# Patient Record
Sex: Male | Born: 1945 | Race: White | Hispanic: No | Marital: Married | Smoking: Never smoker
Health system: Southern US, Community
[De-identification: ages and names within clinical notes are randomized; demographics above are authoritative.]

## PROBLEM LIST (undated history)

## (undated) DIAGNOSIS — I471 Supraventricular tachycardia, unspecified: Secondary | ICD-10-CM

## (undated) DIAGNOSIS — S2239XA Fracture of one rib, unspecified side, initial encounter for closed fracture: Secondary | ICD-10-CM

## (undated) DIAGNOSIS — K219 Gastro-esophageal reflux disease without esophagitis: Secondary | ICD-10-CM

## (undated) DIAGNOSIS — I4891 Unspecified atrial fibrillation: Secondary | ICD-10-CM

## (undated) DIAGNOSIS — J939 Pneumothorax, unspecified: Secondary | ICD-10-CM

## (undated) HISTORY — PX: TONSILLECTOMY: SUR1361

## (undated) HISTORY — DX: Supraventricular tachycardia, unspecified: I47.10

## (undated) HISTORY — DX: Supraventricular tachycardia: I47.1

## (undated) HISTORY — PX: WISDOM TOOTH EXTRACTION: SHX21

## (undated) HISTORY — PX: REFRACTIVE SURGERY: SHX103

## (undated) HISTORY — DX: Unspecified atrial fibrillation: I48.91

## (undated) HISTORY — PX: COLONOSCOPY: SHX174

## (undated) HISTORY — DX: Gastro-esophageal reflux disease without esophagitis: K21.9

## (undated) HISTORY — PX: UPPER GASTROINTESTINAL ENDOSCOPY: SHX188

---
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
## 1984-04-19 HISTORY — PX: LUMBAR DISC SURGERY: SHX700

## 2001-01-17 HISTORY — PX: LUMBAR DISC SURGERY: SHX700

## 2001-02-06 ENCOUNTER — Inpatient Hospital Stay (HOSPITAL_COMMUNITY): Admission: EM | Admit: 2001-02-06 | Discharge: 2001-02-08 | Payer: Self-pay | Admitting: Emergency Medicine

## 2001-02-07 ENCOUNTER — Encounter: Payer: Self-pay | Admitting: Family Medicine

## 2001-02-07 ENCOUNTER — Encounter: Payer: Self-pay | Admitting: Neurological Surgery

## 2003-09-18 ENCOUNTER — Encounter: Admission: RE | Admit: 2003-09-18 | Discharge: 2003-09-18 | Payer: Self-pay | Admitting: Family Medicine

## 2004-01-18 ENCOUNTER — Encounter: Payer: Self-pay | Admitting: Internal Medicine

## 2004-01-18 DIAGNOSIS — K219 Gastro-esophageal reflux disease without esophagitis: Secondary | ICD-10-CM | POA: Insufficient documentation

## 2005-05-12 ENCOUNTER — Ambulatory Visit: Payer: Self-pay | Admitting: Internal Medicine

## 2005-09-18 ENCOUNTER — Observation Stay (HOSPITAL_COMMUNITY): Admission: EM | Admit: 2005-09-18 | Discharge: 2005-09-19 | Payer: Self-pay | Admitting: Emergency Medicine

## 2005-09-18 ENCOUNTER — Encounter: Payer: Self-pay | Admitting: Internal Medicine

## 2005-09-18 ENCOUNTER — Ambulatory Visit: Payer: Self-pay | Admitting: Internal Medicine

## 2006-06-17 ENCOUNTER — Ambulatory Visit: Payer: Self-pay | Admitting: Internal Medicine

## 2007-05-31 ENCOUNTER — Ambulatory Visit: Payer: Self-pay | Admitting: Internal Medicine

## 2007-09-08 DIAGNOSIS — K449 Diaphragmatic hernia without obstruction or gangrene: Secondary | ICD-10-CM | POA: Insufficient documentation

## 2009-01-03 ENCOUNTER — Encounter (INDEPENDENT_AMBULATORY_CARE_PROVIDER_SITE_OTHER): Payer: Self-pay | Admitting: *Deleted

## 2009-01-15 ENCOUNTER — Inpatient Hospital Stay (HOSPITAL_COMMUNITY): Admission: EM | Admit: 2009-01-15 | Discharge: 2009-01-16 | Payer: Self-pay | Admitting: Emergency Medicine

## 2009-08-23 ENCOUNTER — Telehealth: Payer: Self-pay | Admitting: Internal Medicine

## 2010-06-05 ENCOUNTER — Encounter (INDEPENDENT_AMBULATORY_CARE_PROVIDER_SITE_OTHER): Payer: Self-pay | Admitting: *Deleted

## 2010-07-04 ENCOUNTER — Encounter (INDEPENDENT_AMBULATORY_CARE_PROVIDER_SITE_OTHER): Payer: Self-pay | Admitting: *Deleted

## 2010-07-09 ENCOUNTER — Ambulatory Visit: Payer: Self-pay | Admitting: Internal Medicine

## 2010-07-24 ENCOUNTER — Encounter: Payer: Self-pay | Admitting: Internal Medicine

## 2010-07-24 ENCOUNTER — Ambulatory Visit
Admission: RE | Admit: 2010-07-24 | Discharge: 2010-07-24 | Payer: Self-pay | Source: Home / Self Care | Attending: Internal Medicine | Admitting: Internal Medicine

## 2010-07-26 ENCOUNTER — Encounter: Payer: Self-pay | Admitting: Internal Medicine

## 2010-08-19 NOTE — Letter (Signed)
Summary: Pre Visit Letter Revised  [facility]  [address] [place], [place] [address]   Phone: [phone]  Fax: [phone]        06/05/2010 MRN: [id number]    Austin Graves [address] [place], [place]  [address]             Procedure Date:  July 24, 2010   Welcome to the Gastroenterology Division at [facility].    You are scheduled to see a nurse for your pre-procedure visit on July 09, 2010 at 8:00 A.M. on the 3rd floor at [facility], [address]  We ask that you try to arrive at our office 15 minutes prior to your appointment time to allow for check-in.  Please take a minute to review the attached form.  If you answer "Yes" to one or more of the questions on the first page, we ask that you call the person listed at your earliest opportunity.  If you answer "No" to all of the questions, please complete the rest of the form and bring it to your appointment.    Your nurse visit will consist of discussing your medical and surgical history, your immediate family medical history, and your medications.   If you are unable to list all of your medications on the form, please bring the medication bottles to your appointment and we will list them.  We will need to be aware of both prescribed and over the counter drugs.  We will need to know exact dosage information as well.    Please be prepared to read and sign documents such as consent forms, a financial agreement, and acknowledgement forms.  If necessary, and with your consent, a friend or relative is welcome to sit-in on the nurse visit with you.  Please bring your insurance card so that we may make a copy of it.  If your insurance requires a referral to see a specialist, please bring your referral form from your primary care physician.  No co-pay is required for this nurse visit.     If you cannot keep your appointment, please call [phone] to cancel or reschedule prior to your appointment  date.  This allows [place] the opportunity to schedule an appointment for another patient in need of care.    Thank you for choosing [facility] for your medical needs.  We appreciate the opportunity to care for you.  Please visit [place] at our website  to learn more about our practice.  Sincerely, The Gastroenterology Division

## 2010-08-19 NOTE — Progress Notes (Signed)
Summary: Schedule Colonoscopy  Phone Note Outgoing Call   Call placed by: Hortense Ramal CMA Duncan Dull),  August 23, 2009 3:52 PM Call placed to: Patient Summary of Call: Patient needs a recall colonoscopy due to his family history of colonic polyps. I have left a message on patient's voicemail for him to call us back. Hortense Ramal CMA Duncan Dull)  August 23, 2009 3:53 PM   Follow-up for Phone Call        ---- 08/26/2009 11:57 AM, Leanor Kail [facility] wrote: Patient can only do fridays. None in Feb or March 2011, will call back for a friday in April or May 2011.

## 2010-08-19 NOTE — Letter (Signed)
Summary: Recall Colonoscopy Letter  [facility]  [address] [place], [place] [address]   Phone: [phone]  Fax: [phone]      January 03, 2009 MRN: [id number]   Austin Graves [address] [place], [place]  [address]   Dear Austin Graves,   According to your medical record, it is time for you to schedule a Colonoscopy. The American Cancer Society recommends this procedure as a method to detect early colon cancer. Patients with a family history of colon cancer, or a personal history of colon polyps or inflammatory bowel disease are at increased risk.  This letter has beeen generated based on the recommendations made at the time of your procedure. If you feel that in your particular situation this may no longer apply, please contact our office.  Please call our office at [phone] to schedule this appointment or to update your records at your earliest convenience.  Thank you for cooperating with [place] to provide you with the very best care possible.   Sincerely,  Wilhemina Bonito. Marina Goodell, M.D.  [facility] Gastroenterology Division [phone]

## 2010-08-19 NOTE — Procedures (Signed)
Summary: Gastroenterology egd  Gastroenterology egd   Imported By: Harrietta Guardian 09/08/2007 11:06:57  _____________________________________________________________________  External Attachment:    Type:   Image     Comment:   External Document

## 2010-08-19 NOTE — Procedures (Signed)
Summary: Gastroenterology colon  Gastroenterology colon   Imported By: Harrietta Guardian 09/08/2007 11:06:23  _____________________________________________________________________  External Attachment:    Type:   Image     Comment:   External Document

## 2010-08-21 NOTE — Procedures (Signed)
Summary: Colonoscopy  Patient: Austin Graves Note: All result statuses are Final unless otherwise noted.  Tests: (1) Colonoscopy (COL)   COL Colonoscopy           DONE     [facility]     [address]     [place], [place]  [address]           COLONOSCOPY PROCEDURE REPORT           PATIENT:  [name], [name]  MR#:  [id number]     BIRTHDATE:  [birth year], 64 yrs. old  GENDER:  male     ENDOSCOPIST:  Wilhemina Bonito. Eda Keys, MD     REF. BY:  Screening / Recall     PROCEDURE DATE:  07/24/2010     PROCEDURE:  Colonoscopy with snare polypectomy X 2     ASA CLASS:  Class II     INDICATIONS:  family Hx of polyps, Routine Risk Screening ; FATHER     WITH PRECANCEROUS COLONIC MASS AGE 50+     MEDICATIONS:   Fentanyl 100 mcg IV, Versed 10 mg IV           DESCRIPTION OF PROCEDURE:   After the risks benefits and     alternatives of the procedure were thoroughly explained, informed     consent was obtained.  Digital rectal exam was performed and     revealed no abnormalities.   The LB 180AL [id number] endoscope was     introduced through the anus and advanced to the cecum, which was     identified by both the appendix and ileocecal valve, without     limitations.Time to cecum = 2:18 min.  The quality of the prep was     excellent, using MoviPrep.  The instrument was then slowly     withdrawn (time = 11:49 min) as the colon was fully examined.     <<PROCEDUREIMAGES>>           FINDINGS:  A 5mm sessile polyp was found in the ascending colon.     Polyp was snared without cautery. Retrieval was successful.   A     diminutive polyp was found in the sigmoid colon. Polyp was snared     without cautery. Retrieval was successful.  Otherwise normal     colonoscopy without other polyps, masses, vascular ectasias, or     inflammatory changes.   Retroflexed views in the rectum revealed     no abnormalities.    The scope was then withdrawn from the patient     and the procedure completed.        COMPLICATIONS:  None     ENDOSCOPIC IMPRESSION:     1) Sessile polyp in the ascending colon - removed     2) Diminutive polyp in the sigmoid colon - removed     3) Otherwise normal colonoscopy           RECOMMENDATIONS:     1) Repeat colonoscopy in 5 years if polyp adenomatous; otherwise     10 years           ______________________________     Wilhemina Bonito. Eda Keys, MD           CC:  R. Robley Fries, MD;  The Patient           n.     eSIGNED:   Wilhemina Bonito. Eda Keys at 07/24/2010 11:42 AM  [place], [place], [id number]  Note: An exclamation mark (!) indicates a result that was not dispersed into the flowsheet. Document Creation Date: 07/24/2010 11:42 AM _______________________________________________________________________  (1) Order result status: Final Collection or observation date-time: 07/24/2010 11:29 Requested date-time:  Receipt date-time:  Reported date-time:  Referring Physician:   Ordering Physician: Fransico Setters [id number]) Specimen Source:  Source: Launa Grill Order Number: [id number] Lab site:   Appended Document: Colonoscopy recall 5 yrs     Procedures Next Due Date:    Colonoscopy: 07/2015

## 2010-08-21 NOTE — Letter (Signed)
Summary: Patient Notice- Polyp Results  [facility]  [address] [place], [place] [address]   Phone: [phone]  Fax: [phone]        July 26, 2010 MRN: [id number]    Austin Graves [address] [place], [place]  [address]    Dear Mr. Austin Graves,  I am pleased to inform you that the colon polyp(s) removed during your recent colonoscopy was (were) found to be benign (no cancer detected) upon pathologic examination.  I recommend you have a repeat colonoscopy examination in 5 years to look for recurrent polyps, as having colon polyps increases your risk for having recurrent polyps or even colon cancer in the future.  Should you develop new or worsening symptoms of abdominal pain, bowel habit changes or bleeding from the rectum or bowels, please schedule an evaluation with either your primary care physician or with me.   Additional information/recommendations:  __ No further action with gastroenterology is needed at this time. Please      follow-up with your primary care physician for your other healthcare      needs.  Please call us if you are having persistent problems or have questions about your condition that have not been fully answered at this time.  Sincerely,  Hilarie Fredrickson MD  This letter has been electronically signed by your physician.  Appended Document: Patient Notice- Polyp Results Letter mailed

## 2010-08-21 NOTE — Letter (Signed)
Summary: [facility] Instructions  [facility]  [address] [place], [place] [address]   Phone: [phone]  Fax: [phone]       Austin Graves    September 04, 1945    MRN: [id number]        Procedure Day /Date: Thursday 07-24-10       Arrival Time: 9:30 am     Procedure Time: 10:30 am     Location of Procedure:                    _x _  [facility] (4th Floor)                        PREPARATION FOR COLONOSCOPY WITH MOVIPREP   Starting 5 days prior to your procedure  07-19-10 do not eat nuts, seeds, popcorn, corn, beans, peas,  salads, or any raw vegetables.  Do not take any fiber supplements (e.g. Metamucil, Citrucel, and Benefiber).  THE DAY BEFORE YOUR PROCEDURE         DATE:  07-23-10  DAY:  Wednesday   1.  Drink clear liquids the entire day-NO SOLID FOOD  2.  Do not drink anything colored red or purple.  Avoid juices with pulp.  No orange juice.  3.  Drink at least 64 oz. (8 glasses) of fluid/clear liquids during the day to prevent dehydration and help the prep work efficiently.  CLEAR LIQUIDS INCLUDE: Water Jello Ice Popsicles Tea (sugar ok, no milk/cream) Powdered fruit flavored drinks Coffee (sugar ok, no milk/cream) Gatorade Juice: apple, white grape, white cranberry  Lemonade Clear bullion, consomm, broth Carbonated beverages (any kind) Strained chicken noodle soup Hard Candy                             4.  In the morning, mix first dose of MoviPrep solution:    Empty 1 Pouch A and 1 Pouch B into the disposable container    Add lukewarm drinking water to the top line of the container. Mix to dissolve    Refrigerate (mixed solution should be used within 24 hrs)  5.  Begin drinking the prep at 5:00 p.m. The MoviPrep container is divided by 4 marks.   Every 15 minutes drink the solution down to the next mark (approximately 8 oz) until the full liter is complete.   6.  Follow completed prep with 16 oz of clear liquid of your choice  (Nothing red or purple).  Continue to drink clear liquids until bedtime.  7.  Before going to bed, mix second dose of MoviPrep solution:    Empty 1 Pouch A and 1 Pouch B into the disposable container    Add lukewarm drinking water to the top line of the container. Mix to dissolve    Refrigerate  THE DAY OF YOUR PROCEDURE      DATE: 07-24-10  DAY:  Thursday  Beginning at  5:30 a.m. (5 hours before procedure):         1. Every 15 minutes, drink the solution down to the next mark (approx 8 oz) until the full liter is complete.  2. Follow completed prep with 16 oz. of clear liquid of your choice.    3. You may drink clear liquids until  8:30 a.m.  (2 HOURS BEFORE PROCEDURE).   MEDICATION INSTRUCTIONS  Unless otherwise instructed, you should take regular prescription medications with a small sip  of water   as early as possible the morning of your procedure.        OTHER INSTRUCTIONS  You will need a responsible adult at least 63 years of age to accompany you and drive you home.   This person must remain in the waiting room during your procedure.  Wear loose fitting clothing that is easily removed.  Leave jewelry and other valuables at home.  However, you may wish to bring a book to read or  an iPod/MP3 player to listen to music as you wait for your procedure to start.  Remove all body piercing jewelry and leave at home.  Total time from sign-in until discharge is approximately 2-3 hours.  You should go home directly after your procedure and rest.  You can resume normal activities the  day after your procedure.  The day of your procedure you should not:   Drive   Make legal decisions   Operate machinery   Drink alcohol   Return to work  You will receive specific instructions about eating, activities and medications before you leave.    The above instructions have been reviewed and explained to me by   Wyona Almas RN  July 09, 2010 8:19 AM     I  fully understand and can verbalize these instructions _____________________________ Date _________

## 2010-08-21 NOTE — Miscellaneous (Signed)
Summary: [facility]/prep  Clinical Lists Changes  Medications: Added new medication of MOVIPREP 100 GM  SOLR (PEG-KCL-NACL-NASULF-NA ASC-C) As per prep instructions. - Signed Rx of MOVIPREP 100 GM  SOLR (PEG-KCL-NACL-NASULF-NA ASC-C) As per prep instructions.;  #1 x 0;  Signed;  Entered by: Wyona Almas RN;  Authorized by: Hilarie Fredrickson MD;  Method used: Electronically to [facility] # [phone]*, [address] [place], [place]  [address], Ph: [phone], Fax: [phone] Observations: Added new observation of NKA: T (07/09/2010 7:57)    Prescriptions: MOVIPREP 100 GM  SOLR (PEG-KCL-NACL-NASULF-NA ASC-C) As per prep instructions.  #1 x 0   Entered by:   Wyona Almas RN   Authorized by:   Hilarie Fredrickson MD   Signed by:   Wyona Almas RN on 07/09/2010   Method used:   Electronically to        [facility] # [phone]* (retail)       [address]       [place], [place]  [address]       Ph: [phone]       Fax: [phone]   RxID:   [id number]

## 2010-10-27 LAB — TYPE AND SCREEN
ABO/RH(D): O POS
Antibody Screen: NEGATIVE

## 2010-10-27 LAB — ABO/RH: ABO/RH(D): O POS

## 2010-10-27 LAB — POCT I-STAT, CHEM 8
BUN: 21 mg/dL (ref 6–23)
Calcium, Ion: 1.14 mmol/L (ref 1.12–1.32)
Chloride: 106 mEq/L (ref 96–112)
Creatinine, Ser: 1.1 mg/dL (ref 0.4–1.5)
Glucose, Bld: 113 mg/dL — ABNORMAL HIGH (ref 70–99)
HCT: 41 % (ref 39.0–52.0)
Hemoglobin: 13.9 g/dL (ref 13.0–17.0)
Potassium: 4 mEq/L (ref 3.5–5.1)
Sodium: 139 mEq/L (ref 135–145)
TCO2: 25 mmol/L (ref 0–100)

## 2010-10-27 LAB — PROTIME-INR
INR: 1 (ref 0.00–1.49)
Prothrombin Time: 13.9 seconds (ref 11.6–15.2)

## 2010-10-27 LAB — APTT: aPTT: 22 seconds — ABNORMAL LOW (ref 24–37)

## 2010-12-02 NOTE — Assessment & Plan Note (Signed)
 HEALTHCARE                         GASTROENTEROLOGY OFFICE NOTE   TRENDEN, HAZELRIGG                       MRN:          [id number]  DATE:05/31/2007                            DOB:          Sep 07, 1945    HISTORY:  Mr. Austin Graves presents today for followup.  He is a 65 year old  with a history of reflux disease, for which he has been maintained on  chronic proton pump inhibitor therapy.  He underwent upper endoscopy  January 18, 2004.  Examination was normal without evidence of Barrett's or  other complications.  He had been maintained on Nexium 40 mg daily.  For  the most part, this controlled symptoms, though he would have occasional  breakthrough.  He was last evaluated June 17, 2006.  At that time,  some breakthrough symptoms.  See that dictation for details.  Symptoms  settled down in time.  This past spring he changed from Nexium to  Prilosec OTC due to insurance formulary preference.  He has been taking  Prilosec OTC 20 mg daily.  Still some occasional breakthrough for which  he takes antacids.  No dysphagia or other problems.   His only medication is Prilosec OTC.   HE HAS NO KNOWN DRUG ALLERGIES.   PHYSICAL EXAMINATION:  Finds a well-appearing male in no acute distress.  Blood pressure is 122/74.  Heart rate is 60.  Weight is 191 pounds.  HEENT:  Sclerae anicteric.  ABDOMEN:  Soft without tenderness, mass, or hernia.  Good bowel sounds  heard.   IMPRESSION:  Gastroesophageal reflux disease.  Currently with good  control on Prilosec OTC.   RECOMMENDATIONS:  1. Continue reflux precautions.  2. Continue Prilosec OTC.  3. For significant breakthrough symptoms, he can increase his dosage      to 40 mg daily, or for rare breakthrough symptoms, continue with      antacids.  4. Routine GI followup in 2 years, unless interval questions or      problems.     Wilhemina Bonito. Marina Goodell, MD  Electronically Signed    JNP/MedQ  DD: 05/31/2007  DT:  05/31/2007  Job #: [id number]
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
## 2010-12-05 NOTE — H&P (Signed)
NAME:  Austin Graves, Austin Graves NO.:  [id number]   MEDICAL RECORD NO.:  [id number]          PATIENT TYPE:  INP   LOCATION:  1823                         FACILITY:  [facility]   PHYSICIAN:  Pricilla Riffle, M.D.    DATE OF BIRTH:  1945/08/28   DATE OF ADMISSION:  09/18/2005  DATE OF DISCHARGE:                                HISTORY & PHYSICAL   PRIMARY CARDIOLOGIST:  Dr. Dietrich Pates (new)   REASON FOR ADMISSION:  Mr. Esguerra is a very pleasant 65 year old male with no  prior cardiac history who now presents to the emergency room via a direct  transfer with Dr. Andrey Campanile for evaluation of new onset midsternal chest pain.   Patient presents with no prior history of exertional chest discomfort.  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  He has never experienced any exertional chest pain, pressure, or  tightness.   Patient awoke approximately 3:00 this morning with midsternal chest pain  which was easily exacerbated by movement of the thorax and upper  extremities.  He finally found a suitable position which ameliorated his  discomfort, went back to sleep.  After awaking this morning, however, he had  recurrent pain which again was was exacerbated by movement, but not by  walking.  He also noted exacerbation with deep inspiration.  He took two  aspirin and then presented to Dr. Andrey Campanile.   A 12-lead electrocardiogram in the office showed no acute changes but given  the patient's persistent discomfort, albeit less intense than the initial  episode earlier this morning, he was taken directly by Dr. Andrey Campanile to the  emergency room.   Patient denies any recent prodromal illnesses other than a mild cold which  resolved approximately two weeks ago.  He also denies any recent paroxysmal  nocturnal dyspnea, orthopnea, or lower extremity edema.   ALLERGIES:  No known drug allergies.   CURRENT MEDICATIONS:  Nexium 40 daily.   PAST MEDICAL  HISTORY:  1.  Gastritis by EGD 2005.  2.  History of palpitations approximately 10 years ago with negative work-up      by a member of Dr. Weyman Croon [facility] group.  3.  Status post appendectomy 1985.  4.  Status post lumbar laminectomy (x2).   SOCIAL HISTORY:  Patient lives here in [place] with his wife.  They have  two children.  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  He  has never smoked tobacco and drinks alcohol only on rare occasion.  As noted  earlier, he is an active cyclist covering some 6000 miles a year.   FAMILY HISTORY:  Mother age 5 with no known heart disease.  Father age 85  status post bypass surgery in the early 1990s.  Siblings have no known heart  disease.  Patient does report a paternal uncle who died at the age of 62  from a heart attack and had history of MI/bypass surgery.   REVIEW OF  SYSTEMS:  As noted per HPI.  Has occasional heartburn with no  documented peptic ulcer disease and reports that these current symptoms are  dissimilar from his typical reflux symptoms.  Remaining systems negative.  Denies any recent fever, chills, productive cough, or sweats.  Denies any  dysuria.  Remaining systems negative.   PHYSICAL EXAMINATION:  VITAL SIGNS:  Blood pressure 156/92, pulse 64,  regular, respirations 20, temperature 98.2, saturations 97% room air.  GENERAL:  65 year old male sitting upright in no apparent distress.  HEENT:  Normocephalic, atraumatic.  NECK:  Preserved bilateral carotid pulses without bruits.  No thyromegaly.  LUNGS:  Clear to auscultation in all fields.  HEART:  Regular rate and rhythm (S1, S2) with no murmurs, rubs, or gallops.  ABDOMEN:  Soft, nontender.  Intact bowel sounds without bruits.  EXTREMITIES:  Palpable peripheral pulses without edema.  NEUROLOGIC:  No focal deficit.   Admission chest x-ray:  Pending.   Admission electrocardiogram:   Normal sinus rhythm at 61 bpm with normal  axis; rSr prime; no ischemic changes.   LABORATORY DATA:  WBC 5.6, hemoglobin 14.7, hematocrit 43.3, platelet 219,  neutrophils 85%.  INR 1.  D-dimer less than 0.22.  Sodium 139, potassium 4,  glucose 110, BUN 15, creatinine 1.  Liver enzymes normal.  Initial cardiac  enzymes:  CPK 77/1.3, troponin I 0.02.   Admission chest x-ray:  Pending.   IMPRESSION:  Mr. Mayol is a very pleasant 65 year old male with no prior  cardiac history and essentially negative cardiac risk factors except age who  presents to the emergency room with chest pain which is atypical for  ischemia.  His discomfort is exacerbated by movement and deep inspiration  and is more suggestive of pleurisy versus pericarditis.  Initial cardiac  enzymes are negative as is a D-dimer and EKG is benign.   PLAN:  Patient will be admitted for overnight observation and we will cycle  cardiac markers and repeat an EKG in the morning.  We will also add an ESR  level.  A 2-D echocardiogram has been ordered to exclude pericardial  effusion in assessment of left ventricular function and rule out of any  structural abnormalities.   Medical therapy will consist of aspirin and a trial of nonsteroidals with  ibuprofen at 600 mg t.i.d.  Further recommendations will be made pending  review of the echocardiogram and, if work-up is benign and the patient's  symptoms are improved, recommendation would be to discharge him home in the  morning with continued trial of nonsteroidals.      Gene Serpe, P.A. [facility]    ______________________________  Pricilla Riffle, M.D.    GS/MEDQ  D:  09/18/2005  T:  09/18/2005  Job:  [id number]   cc:   Vale Haven. Andrey Campanile, M.D.  Fax: [phone]

## 2010-12-05 NOTE — Op Note (Signed)
[place]  Patient:    Austin Graves, Austin Graves                       MRN: [id number] Proc. Date: 02/07/01 Adm. 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  OPERATION PERFORMED:  Right L4-5 microendoscopic diskectomy.  SURGEON:  Stefani Dama, M.D.  ASSISTANT:  None.  ANESTHESIA:  General endotracheal.  INDICATIONS FOR PROCEDURE:  The patient is a 65 year old right-handed individual who had a herniated nucleus pulposus at L4-5 with severe right lumbar radiculopathy including weakness in the tibialis anterior and extensor hallucis longus.  He was advised regarding the surgery.  DESCRIPTION OF PROCEDURE:  The patient was brought to the operating room supine on the stretcher.  After smooth induction of general endotracheal anesthesia, he was turned prone.  The back was shaved, prepped with DuraPrep and draped in a sterile fashion.  A fluoroscopy unit was used to localize the L4-5 space and then a right paramedian entry site was chosen over the lamina of L4.  A K-wire was passed to the lamina of L4 and then using a series of dilators, a 5 cm x 18 mm diameter endoscopic cannula was placed over the L4-5 interspace.  This was affixed to the table with a frame.  The microscope was then draped and brought into the field and used for microdissection technique. The soft tissues were cleared from the interlaminar space at L4-5.  Then using a high speed drill and 2.3 mm dissecting tool, the laminotomy was created removing the inferior margin of the lamina of L4 out to the mesial wall of the facet on the right side, the yellow ligament was taken up in two layers and then the dissection was carried out to  the lateral aspect.  The common dural tube was identified.  The lateral aspect of the dural tube was identified and then the dissection was carried down along the dural tube to the take-off of the L5 nerve root.  The nerve root was noted to be tented dorsally.  With some microdissection technique and division of the epidural veins in this area, the sac was mobilized medially and underneath this was noted to be a large fragment of disk in the L4-5 space.  This fragment was removed in a piecemeal fashion and this allowed for gradual good decompression of the L5 nerve root in its exit zone.  The procedure was continued and there was noted to be a fragment of disk that protruded from the disk space itself.  The posterior longitudinal ligament was opened with a #15 blade and then a series of 2 and 3 mm Kerrison punches was used to remove some of the posterior longitudinal ligament to enter the disk space more fully and a significant quantity of markedly degenerated disk material was encountered in the L4-5 space.  This was cleared so as to relieve the pressure on the common dural tube and the take off of the L5 nerve root.  In the end the disk space was evacuated of a significant quantity of markedly degenerated disk material.  Once this was finished, hemostasis in the  soft tissues was obtained.  The path of the common dural tube and take off of the L5 nerve root was noted to be clear and hemostatically maintained.  The procedure was then discontinued removing the endoscopic cannula and closing the fascia with 3-0 Vicryl in interrupted fashion and 3-0 Vicryl in the subcuticular tissues.  The patient tolerated the procedure well. DD:  02/07/01 TD:  02/08/01 Job: [id number]

## 2010-12-05 NOTE — Assessment & Plan Note (Signed)
[facility]                         GASTROENTEROLOGY OFFICE NOTE   CHOU, BUSLER                       MRN:          [id number]  DATE:06/17/2006                            DOB:          1946/01/11    A former patient of Dr. Weyman Croon [facility]'s.   HISTORY:  The patient presents today for a followup.   He is a 65 year old who was evaluated in May of 2005 for reflux disease  and screening colonoscopy.  The colonoscopy performed January 18, 2004 was  normal.  In addition, upper endoscopy was normal.  He was diagnosed with  gastroesophageal reflux disease.  Since that time, he has continued on  Nexium.  He was evaluated May 12, 2005 in followup, at that time  doing well on medications.  He presents today for followup.  In the  interim, he has been healthy.  He did report to the hospital for  problems, eventually diagnosed as musculoskeletal chest pain.  More  recently, he has noticed some breakthrough symptoms on Nexium.  This can  occur particularly after lunch.  Noted about 2 to 3 times per week.  Does use antacids p.r.n.  No dysphagia or abdominal pain.  His weight  has been stable.  We also discussed recent study regarding possible  issues with calcium absorption and osteoporosis secondary to proton pump  inhibitors.   PHYSICAL EXAM:  Well-appearing, now in no acute distress.  Blood pressure 110/78, heart rate 60, weight 191.6 pounds.  LUNGS:  Clear.  HEART:  Regular.  ABDOMEN:  Soft without tenderness, mass, or hernia.  No organomegaly.   IMPRESSION:  Gastroesophageal reflux disease.  Currently with some  breakthrough symptoms on Nexium 40 mg daily.   RECOMMENDATIONS:  1. Continue Nexium 40 mg daily.  A prescription with multiple refills      has been provided.  I have also given him 10 days of samples of      Nexium should his breakthrough symptoms worsen. If so, I would like      him to try b.i.d. dosage for 10 days and if his quality of  life is      improved, then we can adjust his prescription.  Otherwise, he can      feel free to use antacids on a p.r.n. basis.  2. Ongoing adherence to reflux precautions.  3. Office followup in 1 year unless interval questions or problems.     Wilhemina Bonito. Marina Goodell, MD  Electronically Signed   JNP/MedQ  DD: 06/17/2006  DT: 06/17/2006  Job #: [id number]   cc:   Quita Skye. Artis Flock, M.D.

## 2010-12-05 NOTE — Discharge Summary (Signed)
NAMEDUSTIN, Austin Graves NO.:  [id number]   MEDICAL RECORD NO.:  [id number]          PATIENT TYPE:  OBV   LOCATION:  4715                         FACILITY:  [facility]   PHYSICIAN:  Jesse Sans. Wall, M.D.   DATE OF BIRTH:  05/04/1946   DATE OF ADMISSION:  09/18/2005  DATE OF DISCHARGE:  09/19/2005                                 DISCHARGE SUMMARY   PRIMARY CARDIOLOGIST:  Pricilla Riffle, M.D. (new).   PRINCIPAL DIAGNOSIS:  Musculoskeletal chest pain.   SECONDARY DIAGNOSES:  1.  History of gastritis.  2.  Remote palpitations.   REASON FOR ADMISSION:  Mr. Beigel is a 65 year old male, with no prior  cardiac history, who was referred directly to the emergency room by Dr.  Andrey Campanile for evaluation of new onset mid sternal chest pain.   HOSPITAL COURSE:  Patient's symptoms were felt to be most likely  musculoskeletal in etiology based on history and physical examination  findings.  He was kept for overnight observation and ruled out for  myocardial infarction with all serial cardiac markers within normal limits.  Additionally, D-dimer was negative.   A 2-D echocardiography was performed to exclude pericardial effusion.  This  showed normal left ventricular function with mild aortic valve thickness and  minimal anterior pericardial effusion.   Patient was cleared for discharge by Dr. Valera Castle, with instructions to  continue medical therapy with ibuprofen for a duration of 10 days.   LABORATORY DATA:  Normal serial cardiac markers (three sets).  Lipid  profile:  Total cholesterol 135, triglycerides 66, HDL 46 and LD 76.  D-  dimer less than 0.22.  TSH 1.39.  CBC normal.  Electrolytes and renal  function normal.  Liver enzymes normal.   DISCHARGE MEDICATIONS:  1.  Advil 600 mg q.i.d. (x10 days).  2.  Nexium 40 mg daily.   FOLLOW UP:  Patient was instructed to arrange follow-up with Dr. Margrett Rud in the next few weeks.   DISPOSITION:  Stable.   DURATION OF  DISCHARGE ENCOUNTER:  Less than 30 minutes.      Gene Serpe, P.A. [facility]      Thomas C. Wall, M.D.  Electronically Signed    GS/MEDQ  D:  10/15/2005  T:  10/16/2005  Job:  [id number]   cc:   Vale Haven. Andrey Campanile, M.D.  Fax: [phone]

## 2010-12-05 NOTE — H&P (Signed)
[place]  Patient:    Austin Graves, Austin Graves                         MRN: [id number] Adm. Date:  02/06/01 Attending:  Vale Haven. Andrey Campanile, M.D.                         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  He was seen in the office on February 04, 2001, with some difficulty in dorsiflexing his right foot, and back pain with positive straight leg raising.  He was begun on anti-inflammatory, muscle relaxant, and prednisone, and also was given Vicodin a day afterwards.  He had had horrible pain and was unable to control it despite all of the above medications, and was seen in the emergency room and examined, and found to have paresthesias of the lower extremity with continued severe pain, uncontrollable with outpatient narcotics.  He is admitted for an MRI scan, pain control, a surgical consultation, and probable laminectomy.  ALLERGIES:  No known drug allergies.  CURRENT MEDICATIONS:  None.  FAMILY HISTORY:  His father has had heart disease and hypertension.  His mother has had cancer and a cerebral hemorrhage.  He has one brother.  SOCIAL HISTORY:  No tobacco.  Married.  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  He is very healthy and active.  He rides a bike.  PAST MEDICAL/SURGICAL HISTORY/HOSPITALIZATIONS:  The patient has had a prior laminectomy 17 years prior at I believe all three levels.  REVIEW OF SYSTEMS:  The patient is a healthy active person who has no general complaints or ongoing illness.  He had an episode of palpitations three years ago, which was not felt to be significant by a cardiology consultation.  PHYSICAL EXAMINATION:  VITAL SIGNS:  Temperature 98 degrees, pulse 80, respirations 18, blood pressure 140/80.  SKIN:  Cool, dry, good turgor, no acute  rash.  HEENT:  Tympanic membranes clear.  Mucous membranes moist.  NECK:  No neck masses or bruits.  CARDIORESPIRATORY:  No murmur heard.  The PMI is normal.  A regular rate and rhythm.  No rales or rhonchi.  ABDOMEN:  Soft, nontender.  Bowel sounds normal.  No masses or organomegaly.  NEUROLOGIC/MUSCULOSKELETAL:  Positive straight leg raising test.  Normal deep tendon reflexes in the lower extremities.  Severe pain with the slightest movement of the right leg.  There is a mild right foot weakness.  IMPRESSION ON ADMISSION:  Recurring lumbar disk disease.  PLAN:  See the orders.  We will obtain an MRI and a neurosurgical consultation, along with IV morphine for pain. DD:  02/06/01 TD:  02/07/01 Job: [id number]

## 2013-07-19 DIAGNOSIS — L723 Sebaceous cyst: Secondary | ICD-10-CM | POA: Diagnosis not present

## 2013-07-19 DIAGNOSIS — L02229 Furuncle of trunk, unspecified: Secondary | ICD-10-CM | POA: Diagnosis not present

## 2013-08-04 DIAGNOSIS — L723 Sebaceous cyst: Secondary | ICD-10-CM | POA: Diagnosis not present

## 2013-09-20 DIAGNOSIS — N529 Male erectile dysfunction, unspecified: Secondary | ICD-10-CM | POA: Diagnosis not present

## 2013-09-20 DIAGNOSIS — E559 Vitamin D deficiency, unspecified: Secondary | ICD-10-CM | POA: Diagnosis not present

## 2013-09-20 DIAGNOSIS — K219 Gastro-esophageal reflux disease without esophagitis: Secondary | ICD-10-CM | POA: Diagnosis not present

## 2013-09-20 DIAGNOSIS — L820 Inflamed seborrheic keratosis: Secondary | ICD-10-CM | POA: Diagnosis not present

## 2013-09-20 DIAGNOSIS — H9319 Tinnitus, unspecified ear: Secondary | ICD-10-CM | POA: Diagnosis not present

## 2013-09-20 DIAGNOSIS — Z0000 Encounter for general adult medical examination without abnormal findings: Secondary | ICD-10-CM | POA: Diagnosis not present

## 2013-09-20 DIAGNOSIS — H9190 Unspecified hearing loss, unspecified ear: Secondary | ICD-10-CM | POA: Diagnosis not present

## 2013-09-20 DIAGNOSIS — Z125 Encounter for screening for malignant neoplasm of prostate: Secondary | ICD-10-CM | POA: Diagnosis not present

## 2013-09-20 DIAGNOSIS — M654 Radial styloid tenosynovitis [de Quervain]: Secondary | ICD-10-CM | POA: Diagnosis not present

## 2013-11-22 DIAGNOSIS — M79609 Pain in unspecified limb: Secondary | ICD-10-CM | POA: Diagnosis not present

## 2013-11-22 DIAGNOSIS — M654 Radial styloid tenosynovitis [de Quervain]: Secondary | ICD-10-CM | POA: Diagnosis not present

## 2014-04-14 DIAGNOSIS — Z23 Encounter for immunization: Secondary | ICD-10-CM | POA: Diagnosis not present

## 2014-06-28 DIAGNOSIS — H903 Sensorineural hearing loss, bilateral: Secondary | ICD-10-CM | POA: Diagnosis not present

## 2014-09-05 DIAGNOSIS — M659 Synovitis and tenosynovitis, unspecified: Secondary | ICD-10-CM | POA: Diagnosis not present

## 2014-09-05 DIAGNOSIS — M1812 Unilateral primary osteoarthritis of first carpometacarpal joint, left hand: Secondary | ICD-10-CM | POA: Diagnosis not present

## 2014-09-05 DIAGNOSIS — M654 Radial styloid tenosynovitis [de Quervain]: Secondary | ICD-10-CM | POA: Diagnosis not present

## 2014-09-28 DIAGNOSIS — Z79899 Other long term (current) drug therapy: Secondary | ICD-10-CM | POA: Diagnosis not present

## 2014-09-28 DIAGNOSIS — N529 Male erectile dysfunction, unspecified: Secondary | ICD-10-CM | POA: Diagnosis not present

## 2014-09-28 DIAGNOSIS — H9313 Tinnitus, bilateral: Secondary | ICD-10-CM | POA: Diagnosis not present

## 2014-09-28 DIAGNOSIS — E780 Pure hypercholesterolemia: Secondary | ICD-10-CM | POA: Diagnosis not present

## 2014-09-28 DIAGNOSIS — K219 Gastro-esophageal reflux disease without esophagitis: Secondary | ICD-10-CM | POA: Diagnosis not present

## 2014-09-28 DIAGNOSIS — Z125 Encounter for screening for malignant neoplasm of prostate: Secondary | ICD-10-CM | POA: Diagnosis not present

## 2014-09-28 DIAGNOSIS — Z0000 Encounter for general adult medical examination without abnormal findings: Secondary | ICD-10-CM | POA: Diagnosis not present

## 2014-09-28 DIAGNOSIS — E559 Vitamin D deficiency, unspecified: Secondary | ICD-10-CM | POA: Diagnosis not present

## 2014-09-28 DIAGNOSIS — Z136 Encounter for screening for cardiovascular disorders: Secondary | ICD-10-CM | POA: Diagnosis not present

## 2014-09-28 DIAGNOSIS — R208 Other disturbances of skin sensation: Secondary | ICD-10-CM | POA: Diagnosis not present

## 2014-09-28 DIAGNOSIS — H9193 Unspecified hearing loss, bilateral: Secondary | ICD-10-CM | POA: Diagnosis not present

## 2014-09-28 DIAGNOSIS — Z23 Encounter for immunization: Secondary | ICD-10-CM | POA: Diagnosis not present

## 2014-11-22 ENCOUNTER — Encounter: Payer: Self-pay | Admitting: Internal Medicine

## 2015-05-27 DIAGNOSIS — Z23 Encounter for immunization: Secondary | ICD-10-CM | POA: Diagnosis not present

## 2015-08-03 ENCOUNTER — Emergency Department (HOSPITAL_COMMUNITY): Payer: Medicare Other

## 2015-08-03 ENCOUNTER — Inpatient Hospital Stay (HOSPITAL_COMMUNITY)
Admission: EM | Admit: 2015-08-03 | Discharge: 2015-08-08 | DRG: 200 | Disposition: A | Discharge status: Home / Self Care | Payer: Medicare Other | Attending: General Surgery | Admitting: General Surgery

## 2015-08-03 ENCOUNTER — Encounter (HOSPITAL_COMMUNITY): Payer: Self-pay | Admitting: Emergency Medicine

## 2015-08-03 DIAGNOSIS — S43112A Subluxation of left acromioclavicular joint, initial encounter: Secondary | ICD-10-CM | POA: Diagnosis not present

## 2015-08-03 DIAGNOSIS — J869 Pyothorax without fistula: Secondary | ICD-10-CM | POA: Diagnosis not present

## 2015-08-03 DIAGNOSIS — S2242XA Multiple fractures of ribs, left side, initial encounter for closed fracture: Secondary | ICD-10-CM | POA: Diagnosis present

## 2015-08-03 DIAGNOSIS — S299XXA Unspecified injury of thorax, initial encounter: Secondary | ICD-10-CM | POA: Diagnosis not present

## 2015-08-03 DIAGNOSIS — S272XXA Traumatic hemopneumothorax, initial encounter: Secondary | ICD-10-CM

## 2015-08-03 DIAGNOSIS — Z79899 Other long term (current) drug therapy: Secondary | ICD-10-CM | POA: Diagnosis not present

## 2015-08-03 DIAGNOSIS — S0990XA Unspecified injury of head, initial encounter: Secondary | ICD-10-CM | POA: Diagnosis not present

## 2015-08-03 DIAGNOSIS — S199XXA Unspecified injury of neck, initial encounter: Secondary | ICD-10-CM | POA: Diagnosis not present

## 2015-08-03 DIAGNOSIS — S43102A Unspecified dislocation of left acromioclavicular joint, initial encounter: Secondary | ICD-10-CM | POA: Diagnosis present

## 2015-08-03 DIAGNOSIS — J939 Pneumothorax, unspecified: Secondary | ICD-10-CM

## 2015-08-03 DIAGNOSIS — Z4682 Encounter for fitting and adjustment of non-vascular catheter: Secondary | ICD-10-CM

## 2015-08-03 DIAGNOSIS — Z23 Encounter for immunization: Secondary | ICD-10-CM | POA: Diagnosis not present

## 2015-08-03 DIAGNOSIS — S2239XA Fracture of one rib, unspecified side, initial encounter for closed fracture: Secondary | ICD-10-CM | POA: Diagnosis present

## 2015-08-03 DIAGNOSIS — S270XXA Traumatic pneumothorax, initial encounter: Secondary | ICD-10-CM | POA: Diagnosis present

## 2015-08-03 DIAGNOSIS — S2249XA Multiple fractures of ribs, unspecified side, initial encounter for closed fracture: Secondary | ICD-10-CM | POA: Diagnosis not present

## 2015-08-03 DIAGNOSIS — T148 Other injury of unspecified body region: Secondary | ICD-10-CM | POA: Diagnosis not present

## 2015-08-03 DIAGNOSIS — M25512 Pain in left shoulder: Secondary | ICD-10-CM | POA: Diagnosis not present

## 2015-08-03 DIAGNOSIS — S4992XA Unspecified injury of left shoulder and upper arm, initial encounter: Secondary | ICD-10-CM | POA: Diagnosis not present

## 2015-08-03 DIAGNOSIS — Z9689 Presence of other specified functional implants: Secondary | ICD-10-CM

## 2015-08-03 DIAGNOSIS — R079 Chest pain, unspecified: Secondary | ICD-10-CM | POA: Diagnosis not present

## 2015-08-03 DIAGNOSIS — M25552 Pain in left hip: Secondary | ICD-10-CM | POA: Diagnosis not present

## 2015-08-03 HISTORY — DX: Pneumothorax, unspecified: J93.9

## 2015-08-03 HISTORY — DX: Fracture of one rib, unspecified side, initial encounter for closed fracture: S22.39XA

## 2015-08-03 LAB — BASIC METABOLIC PANEL
Anion gap: 9 (ref 5–15)
Anion gap: 11 (ref 5–15)
BUN: 20 mg/dL (ref 6–20)
BUN: 19 mg/dL (ref 6–20)
CO2: 26 mmol/L (ref 22–32)
CO2: 23 mmol/L (ref 22–32)
Calcium: 9.4 mg/dL (ref 8.9–10.3)
Calcium: 9.1 mg/dL (ref 8.9–10.3)
Chloride: 102 mmol/L (ref 101–111)
Chloride: 102 mmol/L (ref 101–111)
Creatinine, Ser: 1.03 mg/dL (ref 0.61–1.24)
Creatinine, Ser: 0.8 mg/dL (ref 0.61–1.24)
GFR calc Af Amer: >60 mL/min (ref >60)
GFR calc Af Amer: >60 mL/min (ref >60)
GFR calc non Af Amer: >60 mL/min (ref >60)
GFR calc non Af Amer: >60 mL/min (ref >60)
Glucose, Bld: 125 mg/dL — ABNORMAL HIGH (ref 65–99)
Glucose, Bld: 145 mg/dL — ABNORMAL HIGH (ref 65–99)
Potassium: 3.9 mmol/L (ref 3.5–5.1)
Potassium: 4.2 mmol/L (ref 3.5–5.1)
Sodium: 137 mmol/L (ref 135–145)
Sodium: 136 mmol/L (ref 135–145)

## 2015-08-03 LAB — CBC
HCT: 41.5 % (ref 39.0–52.0)
Hemoglobin: 14 g/dL (ref 13.0–17.0)
MCH: 31.8 pg (ref 26.0–34.0)
MCHC: 33.7 g/dL (ref 30.0–36.0)
MCV: 94.3 fL (ref 78.0–100.0)
Platelets: 177 10*3/uL (ref 150–400)
RBC: 4.4 MIL/uL (ref 4.22–5.81)
RDW: 12.7 % (ref 11.5–15.5)
WBC: 10.7 10*3/uL — ABNORMAL HIGH (ref 4.0–10.5)

## 2015-08-03 LAB — CBC WITH DIFFERENTIAL/PLATELET
Basophils Absolute: 0 10*3/uL (ref 0.0–0.1)
Basophils Relative: 0 %
Eosinophils Absolute: 0.1 10*3/uL (ref 0.0–0.7)
Eosinophils Relative: 1 %
HCT: 42 % (ref 39.0–52.0)
Hemoglobin: 14 g/dL (ref 13.0–17.0)
Lymphocytes Relative: 15 %
Lymphs Abs: 1.1 10*3/uL (ref 0.7–4.0)
MCH: 31.8 pg (ref 26.0–34.0)
MCHC: 33.3 g/dL (ref 30.0–36.0)
MCV: 95.5 fL (ref 78.0–100.0)
Monocytes Absolute: 0.3 10*3/uL (ref 0.1–1.0)
Monocytes Relative: 4 %
Neutro Abs: 6.2 10*3/uL (ref 1.7–7.7)
Neutrophils Relative %: 80 %
Platelets: 209 10*3/uL (ref 150–400)
RBC: 4.4 MIL/uL (ref 4.22–5.81)
RDW: 12.8 % (ref 11.5–15.5)
WBC: 7.7 10*3/uL (ref 4.0–10.5)

## 2015-08-03 MED ORDER — ONDANSETRON HCL 4 MG/2ML IJ SOLN: 4.0000 mg | Freq: Four times a day (QID) | INTRAMUSCULAR | Status: DC | PRN

## 2015-08-03 MED ORDER — FENTANYL CITRATE (PF) 100 MCG/2ML IJ SOLN
100.0000 ug | INTRAMUSCULAR | Status: AC | PRN
  Administered 2015-08-03 (×2): 100 ug via INTRAVENOUS
  Filled 2015-08-03 (×2): qty 2

## 2015-08-03 MED ORDER — HYDROMORPHONE 1 MG/ML IV SOLN
INTRAVENOUS | Status: DC
  Administered 2015-08-04: 08:00:00 via INTRAVENOUS
  Administered 2015-08-04: 3 mg via INTRAVENOUS
  Administered 2015-08-04: 2.1 mg via INTRAVENOUS
  Administered 2015-08-04: 1.8 mg via INTRAVENOUS
  Administered 2015-08-05: 0.9 mg via INTRAVENOUS
  Administered 2015-08-05: 1.5 mg via INTRAVENOUS
  Filled 2015-08-03: qty 25

## 2015-08-03 MED ORDER — OXYCODONE HCL 5 MG PO TABS
10.0000 mg | ORAL_TABLET | ORAL | Status: DC | PRN
  Administered 2015-08-04: 10 mg via ORAL
  Filled 2015-08-03: qty 2

## 2015-08-03 MED ORDER — OXYCODONE HCL 5 MG PO TABS
5.0000 mg | ORAL_TABLET | ORAL | Status: DC | PRN
  Administered 2015-08-03: 5 mg via ORAL
  Filled 2015-08-03: qty 1

## 2015-08-03 MED ORDER — IOHEXOL 300 MG/ML  SOLN
75.0000 mL | Freq: Once | INTRAMUSCULAR | Status: AC | PRN
  Administered 2015-08-03: 75 mL via INTRAVENOUS

## 2015-08-03 MED ORDER — MORPHINE SULFATE (PF) 4 MG/ML IV SOLN
4.0000 mg | INTRAVENOUS | Status: DC | PRN
  Administered 2015-08-03 – 2015-08-04 (×3): 4 mg via INTRAVENOUS
  Filled 2015-08-03 (×4): qty 1

## 2015-08-03 MED ORDER — NALOXONE HCL 0.4 MG/ML IJ SOLN: 0.4000 mg | INTRAMUSCULAR | Status: DC | PRN

## 2015-08-03 MED ORDER — ONDANSETRON HCL 4 MG PO TABS: 4.0000 mg | ORAL_TABLET | Freq: Four times a day (QID) | ORAL | Status: DC | PRN

## 2015-08-03 MED ORDER — DIPHENHYDRAMINE HCL 50 MG/ML IJ SOLN: 12.5000 mg | Freq: Four times a day (QID) | INTRAMUSCULAR | Status: DC | PRN

## 2015-08-03 MED ORDER — SODIUM CHLORIDE 0.9 % IJ SOLN: 9.0000 mL | INTRAMUSCULAR | Status: DC | PRN

## 2015-08-03 MED ORDER — POTASSIUM CHLORIDE IN NACL 20-0.9 MEQ/L-% IV SOLN
INTRAVENOUS | Status: DC
  Administered 2015-08-03 – 2015-08-05 (×3): via INTRAVENOUS
  Filled 2015-08-03 (×4): qty 1000

## 2015-08-03 MED ORDER — SODIUM CHLORIDE 0.9 % IV BOLUS (SEPSIS)
1000.0000 mL | Freq: Once | INTRAVENOUS | Status: AC
  Administered 2015-08-03: 1000 mL via INTRAVENOUS

## 2015-08-03 MED ORDER — DIPHENHYDRAMINE HCL 12.5 MG/5ML PO ELIX: 12.5000 mg | ORAL_SOLUTION | Freq: Four times a day (QID) | ORAL | Status: DC | PRN

## 2015-08-03 MED ORDER — ENOXAPARIN SODIUM 40 MG/0.4ML ~~LOC~~ SOLN
40.0000 mg | SUBCUTANEOUS | Status: DC
  Administered 2015-08-04 – 2015-08-08 (×5): 40 mg via SUBCUTANEOUS
  Filled 2015-08-03 (×6): qty 0.4

## 2015-08-03 NOTE — ED Notes (Signed)
MD at bedside. 

## 2015-08-03 NOTE — ED Notes (Signed)
Pt. Is back in room. 
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
## 2015-08-03 NOTE — ED Notes (Signed)
Patient returned from X-ray 

## 2015-08-03 NOTE — ED Notes (Signed)
Called lab for update on pending results. They are checking on results now.

## 2015-08-03 NOTE — ED Notes (Signed)
Pt. Is back in room 1621

## 2015-08-03 NOTE — Progress Notes (Signed)
Orthopedic Tech Progress Note Patient Details:  Austin Graves Pamer 08/14/1945 [id number]  Patient ID: Austin Graves Hessel, male   DOB: 01/03/1946, 70 y.o.   MRN: [id number] Did not apply sling to right arm as per rn says it was an error and to only put it on left shoulder.  Trinna PostMartinez, Chandel Zaun J 08/03/2015, 7:16 PM

## 2015-08-03 NOTE — ED Notes (Signed)
Pt. Going to CT
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
## 2015-08-03 NOTE — ED Notes (Signed)
Patient transported to CT 
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
## 2015-08-03 NOTE — ED Notes (Signed)
Patient transported to X-ray 

## 2015-08-03 NOTE — Progress Notes (Signed)
Orthopedic Tech Progress Note Patient Details:  Austin Graves 04/13/1946 [id number]  Ortho Devices Type of Ortho Device: Shoulder immobilizer Ortho Device/Splint Location: lue Ortho Device/Splint Interventions: Ordered, Application   Trinna PostMartinez, Bryleigh Ottaway J 08/03/2015, 7:13 PM

## 2015-08-03 NOTE — ED Notes (Signed)
Patient's wife: Darreld Mcleanerri Siegel may be reached at [phone] should anyone have any questions.

## 2015-08-04 ENCOUNTER — Inpatient Hospital Stay (HOSPITAL_COMMUNITY): Payer: Medicare Other

## 2015-08-04 LAB — MRSA PCR SCREENING: MRSA by PCR: NEGATIVE

## 2015-08-04 MED ORDER — PNEUMOCOCCAL VAC POLYVALENT 25 MCG/0.5ML IJ INJ
0.5000 mL | INJECTION | INTRAMUSCULAR | Status: AC
  Administered 2015-08-05: 0.5 mL via INTRAMUSCULAR
  Filled 2015-08-04: qty 0.5

## 2015-08-04 MED ORDER — LIDOCAINE HCL (PF) 1 % IJ SOLN
INTRAMUSCULAR | Status: AC
  Administered 2015-08-04: 10 mL
  Filled 2015-08-04: qty 5

## 2015-08-04 MED ORDER — LIDOCAINE HCL (PF) 1 % IJ SOLN
INTRAMUSCULAR | Status: AC
  Filled 2015-08-04: qty 5

## 2015-08-04 MED ORDER — LIDOCAINE HCL (CARDIAC) 20 MG/ML IV SOLN
INTRAVENOUS | Status: AC
  Filled 2015-08-04: qty 5

## 2015-08-04 MED ORDER — MIDAZOLAM HCL 2 MG/2ML IJ SOLN
INTRAMUSCULAR | Status: AC
  Administered 2015-08-04: 2 mg
  Filled 2015-08-04: qty 2
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
## 2015-08-05 ENCOUNTER — Inpatient Hospital Stay (HOSPITAL_COMMUNITY): Payer: Medicare Other

## 2015-08-05 DIAGNOSIS — S43102A Unspecified dislocation of left acromioclavicular joint, initial encounter: Secondary | ICD-10-CM | POA: Diagnosis present

## 2015-08-05 DIAGNOSIS — S270XXA Traumatic pneumothorax, initial encounter: Secondary | ICD-10-CM | POA: Diagnosis not present

## 2015-08-05 MED ORDER — HYDROMORPHONE HCL 1 MG/ML IJ SOLN
0.5000 mg | INTRAMUSCULAR | Status: DC | PRN
  Administered 2015-08-05 (×2): 0.5 mg via INTRAVENOUS
  Filled 2015-08-05 (×3): qty 1

## 2015-08-05 MED ORDER — POLYETHYLENE GLYCOL 3350 17 G PO PACK
17.0000 g | PACK | Freq: Every day | ORAL | Status: DC
  Administered 2015-08-05 – 2015-08-08 (×4): 17 g via ORAL
  Filled 2015-08-05 (×4): qty 1

## 2015-08-05 MED ORDER — OXYCODONE HCL 5 MG PO TABS
5.0000 mg | ORAL_TABLET | ORAL | Status: DC | PRN
  Administered 2015-08-05 (×3): 15 mg via ORAL
  Administered 2015-08-06 (×3): 10 mg via ORAL
  Administered 2015-08-06: 15 mg via ORAL
  Administered 2015-08-07: 10 mg via ORAL
  Filled 2015-08-05: qty 3
  Filled 2015-08-05: qty 2
  Filled 2015-08-05: qty 3
  Filled 2015-08-05 (×2): qty 2
  Filled 2015-08-05 (×2): qty 3
  Filled 2015-08-05: qty 2
  Filled 2015-08-05: qty 1

## 2015-08-05 MED ORDER — DOCUSATE SODIUM 100 MG PO CAPS
100.0000 mg | ORAL_CAPSULE | Freq: Two times a day (BID) | ORAL | Status: DC
Start: 2015-08-05 — End: 2015-08-08
  Administered 2015-08-05 – 2015-08-08 (×7): 100 mg via ORAL
  Filled 2015-08-05 (×7): qty 1

## 2015-08-05 MED ORDER — NAPROXEN 250 MG PO TABS
500.0000 mg | ORAL_TABLET | Freq: Two times a day (BID) | ORAL | Status: DC
  Administered 2015-08-05 – 2015-08-08 (×7): 500 mg via ORAL
  Filled 2015-08-05 (×7): qty 2

## 2015-08-05 NOTE — Progress Notes (Signed)
Patient ID: Austin Graves, male   DOB: 02/06/1946, 70 y.o.   MRN: [id number]   LOS: 2 days   Subjective: C/o left chest pain, otherwise no change.   Objective: Vital signs in last 24 hours: Temp:  [98.1 F (36.7 C)-99.1 F (37.3 C)] 99.1 F (37.3 C) (01/16 0449) Pulse Rate:  [57-62] 60 (01/16 0451) Resp:  [12-25] 12 (01/16 0451) BP: (108-132)/(72-75) 132/75 mmHg (01/16 0451) SpO2:  [89 %-99 %] 98 % (01/16 0451)     IS: 750ml   CT No air leak  No OP   Radiology Results CXR: Left PTX increased after resolution with chest tube yesterday (official read pending)   Physical Exam General appearance: alert and no distress Resp: clear to auscultation bilaterally Cardio: regular rate and rhythm GI: normal findings: bowel sounds normal and soft, non-tender   Assessment/Plan: BCC Left AC separation -- Sling per Dr. Victorino DikeHewitt, f/u as OP with Dr. Rennis ChrisSupple on Friday Left rib fx w/PTX s/p CT -- Will increase suction to 30cm FEN -- D/C PCA, orals for pain, add NSAID, SL IV, advance diet VTE -- SCD's, Lovenox Dispo -- To floor    Freeman CaldronMichael J. Annaly Skop, PA-C Pager: [phone] General [facility] [place] Pager: [phone]  08/05/2015

## 2015-08-05 NOTE — Progress Notes (Addendum)
Subjective:     [facility]  Patient reports pain as mild to moderate.  Tolerating POs well.  Admits to BM.  Denies fever, chills, N/V.  Denies numbness/tingling into LUE.  Objective:   VITALS:  Temp:  [98 F (36.7 C)-99.1 F (37.3 C)] 98.4 F (36.9 C) (01/16 1247) Pulse Rate:  [57-63] 63 (01/16 1247) Resp:  [11-16] 16 (01/16 1247) BP: (114-144)/(72-89) 144/83 mmHg (01/16 1247) SpO2:  [89 %-98 %] 93 % (01/16 1247)  General: WDWN patient in NAD. Psych:  Appropriate mood and affect. Neuro:  A&O x 3, Moving all extremities, sensation intact to light touch HEENT:  EOMs intact Chest:  Even non-labored respirations Skin: C/D/I, no rashes or lesions Extremities: warm/dry, mild edema, no erythema or echymosis.  No lymphadenopathy. Pulses: Radial 2+ MSK:  ROM: Elbow 0-100, MMT: Grip strength 5/5 bilaterally    LABS  Recent Labs  08/03/15 1320 08/03/15 1957  HGB 14.0 14.0  WBC 7.7 10.7*  PLT 209 177    Recent Labs  08/03/15 1320 08/03/15 1957  NA 137 136  K 3.9 4.2  CL 102 102  CO2 26 23  BUN 20 19  CREATININE 1.03 0.80  GLUCOSE 125* 145*   No results for input(s): LABPT, INR in the last 72 hours.   Assessment/Plan:     PTX AC separation  PTX treatment per trauma service Sling for comfort of LUE Activity as tolerated Plan for potential outpatient office visit with Dr. Rennis ChrisSupple on Friday.  Alfredo MartinezJustin Jaece Ducharme, PA-C, [facility] Office:  [phone]

## 2015-08-06 ENCOUNTER — Inpatient Hospital Stay (HOSPITAL_COMMUNITY): Payer: Medicare Other

## 2015-08-06 NOTE — Care Management Important Message (Signed)
Important Message  Patient Details  Name: Austin Graves MRN: [id number] Date of Birth: 05/27/1946   Medicare Important Message Given:  Yes    Oralia Rud [facility] 08/06/2015, 3:36 PM

## 2015-08-06 NOTE — Progress Notes (Signed)
Subjective: Pt says the left shoulder is sore but not as bad as the ribs.  He had a chest tube placed on the left after the PTX was larger Sunday morning.   Objective: Vital signs in last 24 hours: Temp:  [97.9 F (36.6 C)] 97.9 F (36.6 C) (01/17 0534) Pulse Rate:  [54-63] 54 (01/17 0534) Resp:  [19-20] 20 (01/17 0534) BP: (122-135)/(78) 122/78 mmHg (01/17 0534) SpO2:  [92 %-94 %] 94 % (01/17 0534)  Intake/Output from previous day: 01/16 0701 - 01/17 0700 In: 840 [P.O.:840] Out: 675 [Urine:625; Chest Tube:50] Intake/Output this shift: Total I/O In: -  Out: 270 [Urine:250; Chest Tube:20]   Recent Labs  08/03/15 1957  HGB 14.0    Recent Labs  08/03/15 1957  WBC 10.7*  RBC 4.40  HCT 41.5  PLT 177    Recent Labs  08/03/15 1957  NA 136  K 4.2  CL 102  CO2 23  BUN 19  CREATININE 0.80  GLUCOSE 145*  CALCIUM 9.1   No results for input(s): LABPT, INR in the last 72 hours.  PE:  wn wd male in nad.  L shoulder slightly ttp at [facility] joint.  Skin intact.  NVI at L UE.  Distal clavicle slightly more prominent that the R side.  Assessment/Plan: L AC separation.  - continue sling to LUE.  Pt should f/u with Dr. Rennis Chris in the office when he's discharged.  F/u info in Epic.  Ortho signing off.   Toni Arthurs 08/06/2015, 4:48 PM
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
## 2015-08-07 ENCOUNTER — Inpatient Hospital Stay (HOSPITAL_COMMUNITY): Payer: Medicare Other

## 2015-08-07 MED ORDER — SACCHAROMYCES BOULARDII 250 MG PO CAPS
250.0000 mg | ORAL_CAPSULE | Freq: Every day | ORAL | Status: DC
Start: 2015-08-07 — End: 2015-08-08
  Administered 2015-08-07 – 2015-08-08 (×2): 250 mg via ORAL
  Filled 2015-08-07 (×2): qty 1

## 2015-08-07 MED ORDER — PANTOPRAZOLE SODIUM 40 MG PO TBEC
40.0000 mg | DELAYED_RELEASE_TABLET | Freq: Every day | ORAL | Status: DC
  Administered 2015-08-07 – 2015-08-08 (×2): 40 mg via ORAL
  Filled 2015-08-07 (×2): qty 1
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
## 2015-08-08 ENCOUNTER — Inpatient Hospital Stay (HOSPITAL_COMMUNITY): Payer: Medicare Other

## 2015-08-08 MED ORDER — DOCUSATE SODIUM 100 MG PO CAPS
100.0000 mg | ORAL_CAPSULE | Freq: Two times a day (BID) | ORAL | Status: DC
Start: 2015-08-08 — End: 2017-11-16

## 2015-08-08 MED ORDER — POLYETHYLENE GLYCOL 3350 17 G PO PACK: 17.0000 g | PACK | Freq: Every day | ORAL | Status: DC | PRN

## 2015-08-08 MED ORDER — NAPROXEN 500 MG PO TABS: 500.0000 mg | ORAL_TABLET | Freq: Two times a day (BID) | ORAL | Status: DC

## 2015-08-08 MED ORDER — OXYCODONE HCL 5 MG PO TABS: 5.0000 mg | ORAL_TABLET | Freq: Four times a day (QID) | ORAL | Status: DC | PRN
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
## 2015-08-08 NOTE — Progress Notes (Addendum)
Austin Graves to be D/C'd Home per MD order.  Discussed with the patient and all questions fully answered.  VSS. Dressing to left chest clean dry and intact. Sling on left arm.    An After Visit Summary was printed and given to the patient. Patient received prescription.  D/c education completed with patient/family including follow up instructions, medication list, d/c activities limitations if indicated, with other d/c instructions as indicated by MD - patient able to verbalize understanding, all questions fully answered.   Patient instructed to return to ED, call 911, or call MD for any changes in condition.   Patient to be escorted via WC, and D/C home via private auto.  L'[place]PER[place], Austin Graves C 08/08/2015 2:49 PM

## 2015-08-09 ENCOUNTER — Telehealth (HOSPITAL_COMMUNITY): Payer: Self-pay | Admitting: Orthopedic Surgery

## 2015-08-09 NOTE — Telephone Encounter (Signed)
Left message that those 2 medicines were OTC.

## 2015-08-12 DIAGNOSIS — S7002XA Contusion of left hip, initial encounter: Secondary | ICD-10-CM | POA: Diagnosis not present

## 2015-08-12 DIAGNOSIS — S43102A Unspecified dislocation of left acromioclavicular joint, initial encounter: Secondary | ICD-10-CM | POA: Diagnosis not present

## 2015-08-14 DIAGNOSIS — S270XXS Traumatic pneumothorax, sequela: Secondary | ICD-10-CM | POA: Diagnosis not present

## 2015-08-14 DIAGNOSIS — S2242XS Multiple fractures of ribs, left side, sequela: Secondary | ICD-10-CM | POA: Diagnosis not present

## 2015-09-09 DIAGNOSIS — S43102D Unspecified dislocation of left acromioclavicular joint, subsequent encounter: Secondary | ICD-10-CM | POA: Diagnosis not present

## 2015-09-09 DIAGNOSIS — M8589 Other specified disorders of bone density and structure, multiple sites: Secondary | ICD-10-CM | POA: Diagnosis not present

## 2015-09-11 ENCOUNTER — Encounter: Payer: Self-pay | Admitting: Internal Medicine

## 2015-09-11 ENCOUNTER — Other Ambulatory Visit: Payer: Self-pay | Admitting: Orthopedic Surgery

## 2015-09-11 DIAGNOSIS — M8589 Other specified disorders of bone density and structure, multiple sites: Secondary | ICD-10-CM

## 2015-09-11 DIAGNOSIS — Z8781 Personal history of (healed) traumatic fracture: Secondary | ICD-10-CM

## 2015-09-11 DIAGNOSIS — R5381 Other malaise: Secondary | ICD-10-CM

## 2015-09-27 ENCOUNTER — Ambulatory Visit
Admission: RE | Admit: 2015-09-27 | Discharge: 2015-09-27 | Disposition: A | Discharge status: Home / Self Care | Payer: Medicare Other | Source: Ambulatory Visit | Attending: Orthopedic Surgery | Admitting: Orthopedic Surgery

## 2015-09-27 DIAGNOSIS — M25552 Pain in left hip: Secondary | ICD-10-CM | POA: Diagnosis not present

## 2015-09-27 DIAGNOSIS — Z8781 Personal history of (healed) traumatic fracture: Secondary | ICD-10-CM

## 2015-09-27 DIAGNOSIS — M8589 Other specified disorders of bone density and structure, multiple sites: Secondary | ICD-10-CM

## 2015-10-10 DIAGNOSIS — M25552 Pain in left hip: Secondary | ICD-10-CM | POA: Diagnosis not present

## 2015-10-25 DIAGNOSIS — Z1389 Encounter for screening for other disorder: Secondary | ICD-10-CM | POA: Diagnosis not present

## 2015-10-25 DIAGNOSIS — E559 Vitamin D deficiency, unspecified: Secondary | ICD-10-CM | POA: Diagnosis not present

## 2015-10-25 DIAGNOSIS — M791 Myalgia: Secondary | ICD-10-CM | POA: Diagnosis not present

## 2015-10-25 DIAGNOSIS — H9193 Unspecified hearing loss, bilateral: Secondary | ICD-10-CM | POA: Diagnosis not present

## 2015-10-25 DIAGNOSIS — N529 Male erectile dysfunction, unspecified: Secondary | ICD-10-CM | POA: Diagnosis not present

## 2015-10-25 DIAGNOSIS — K219 Gastro-esophageal reflux disease without esophagitis: Secondary | ICD-10-CM | POA: Diagnosis not present

## 2015-10-25 DIAGNOSIS — Z0001 Encounter for general adult medical examination with abnormal findings: Secondary | ICD-10-CM | POA: Diagnosis not present

## 2015-10-25 DIAGNOSIS — H9313 Tinnitus, bilateral: Secondary | ICD-10-CM | POA: Diagnosis not present

## 2016-05-12 DIAGNOSIS — Z23 Encounter for immunization: Secondary | ICD-10-CM | POA: Diagnosis not present

## 2016-12-01 DIAGNOSIS — Z0001 Encounter for general adult medical examination with abnormal findings: Secondary | ICD-10-CM | POA: Diagnosis not present

## 2016-12-01 DIAGNOSIS — K219 Gastro-esophageal reflux disease without esophagitis: Secondary | ICD-10-CM | POA: Diagnosis not present

## 2016-12-01 DIAGNOSIS — H9193 Unspecified hearing loss, bilateral: Secondary | ICD-10-CM | POA: Diagnosis not present

## 2016-12-01 DIAGNOSIS — N500 Atrophy of testis: Secondary | ICD-10-CM | POA: Diagnosis not present

## 2016-12-01 DIAGNOSIS — H9313 Tinnitus, bilateral: Secondary | ICD-10-CM | POA: Diagnosis not present

## 2016-12-01 DIAGNOSIS — L720 Epidermal cyst: Secondary | ICD-10-CM | POA: Diagnosis not present

## 2016-12-01 DIAGNOSIS — Z125 Encounter for screening for malignant neoplasm of prostate: Secondary | ICD-10-CM | POA: Diagnosis not present

## 2016-12-01 DIAGNOSIS — Z1389 Encounter for screening for other disorder: Secondary | ICD-10-CM | POA: Diagnosis not present

## 2016-12-01 DIAGNOSIS — E559 Vitamin D deficiency, unspecified: Secondary | ICD-10-CM | POA: Diagnosis not present

## 2016-12-01 DIAGNOSIS — N529 Male erectile dysfunction, unspecified: Secondary | ICD-10-CM | POA: Diagnosis not present

## 2016-12-28 DIAGNOSIS — M5416 Radiculopathy, lumbar region: Secondary | ICD-10-CM | POA: Diagnosis not present

## 2017-03-05 DIAGNOSIS — M5416 Radiculopathy, lumbar region: Secondary | ICD-10-CM | POA: Diagnosis not present

## 2017-03-05 DIAGNOSIS — L720 Epidermal cyst: Secondary | ICD-10-CM | POA: Diagnosis not present

## 2017-04-23 IMAGING — DX DG CHEST 2V
2 series · 2 of 2 positions shown · non-contrast
Comparison: Radiograph dated 08/03/2015 and CT dated 08/03/2015

CLINICAL DATA: 69-year-old male with left-sided pneumothorax.
Follow-up.

EXAM:
CHEST  2 VIEW

[chest lat]
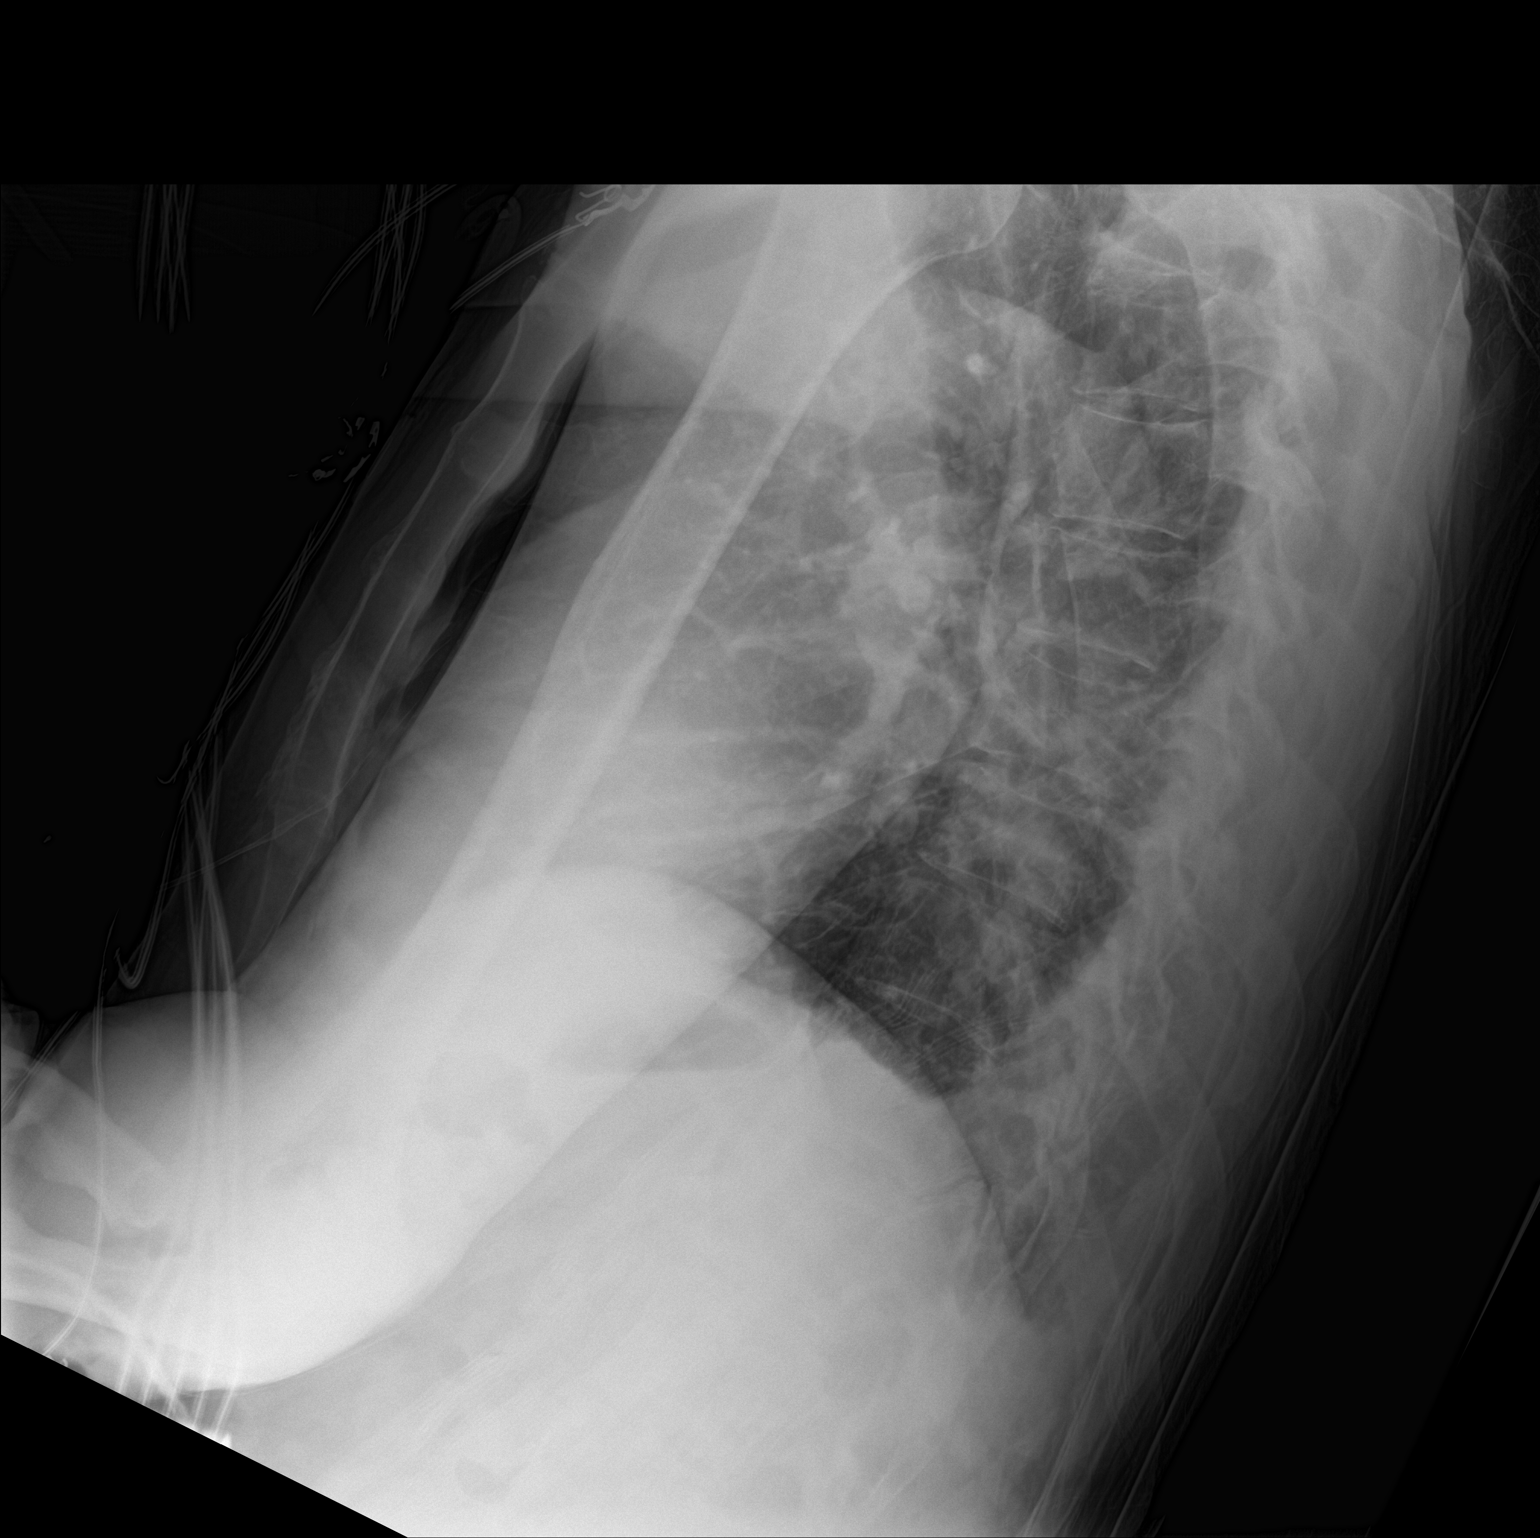

[chest ap]
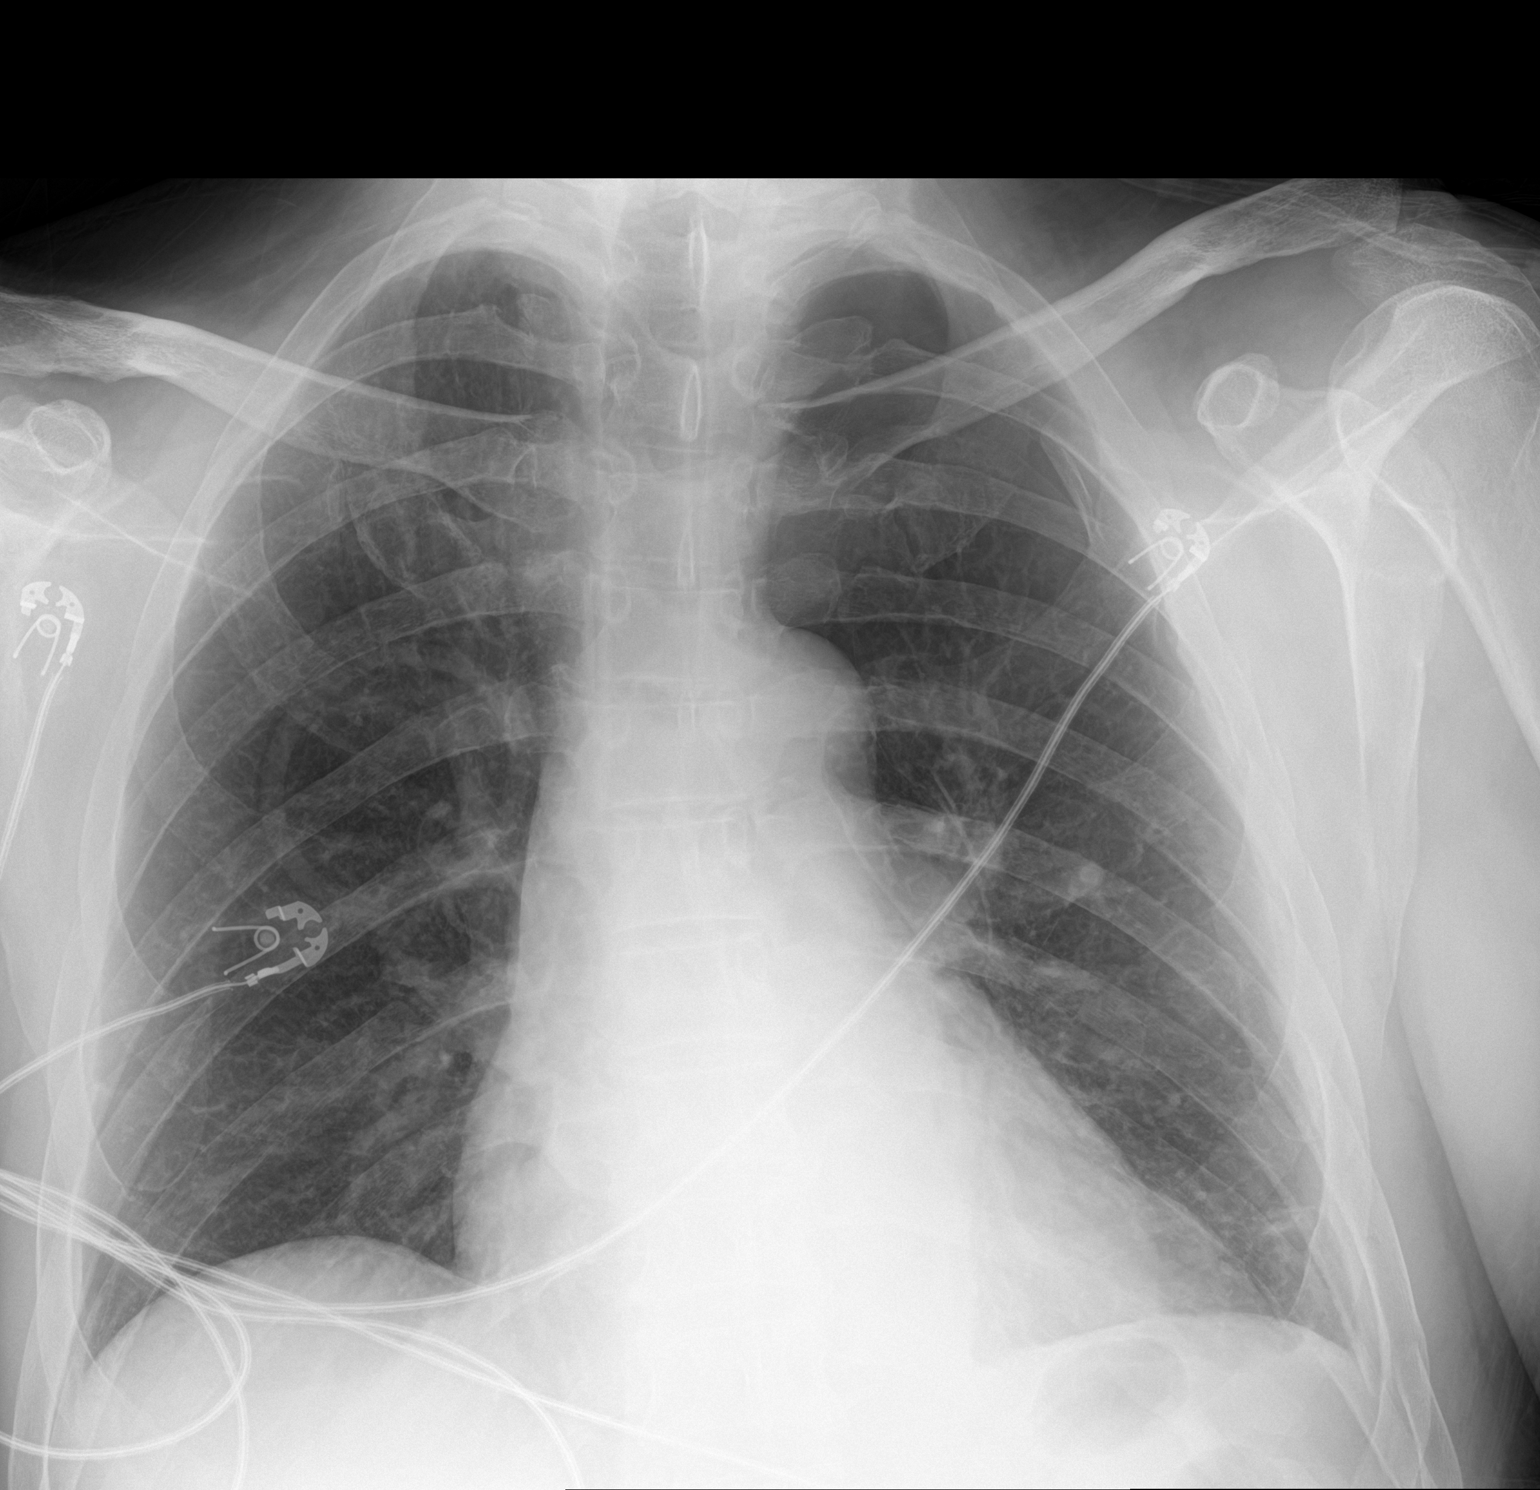

[2 of 2 positions shown; findings below may reference images not displayed]

FINDINGS: There has been interval increase in the left apical pneumothorax
when compared to the radiograph dated 08/03/2015. The pneumothorax
measures approximately 3.6 cm from the apical pleural surface. The
lungs are otherwise clear. No pleural effusion. The cardiac
silhouette is within normal limits.

Mildly displaced fracture of the left 8th rib.
IMPRESSION: Persistent small left-sided pneumothorax.  Follow-up recommended.

## 2017-04-26 DIAGNOSIS — Z23 Encounter for immunization: Secondary | ICD-10-CM | POA: Diagnosis not present

## 2017-04-26 DIAGNOSIS — M5416 Radiculopathy, lumbar region: Secondary | ICD-10-CM | POA: Diagnosis not present

## 2017-04-26 DIAGNOSIS — R030 Elevated blood-pressure reading, without diagnosis of hypertension: Secondary | ICD-10-CM | POA: Diagnosis not present

## 2017-10-05 ENCOUNTER — Encounter: Payer: Self-pay | Admitting: Internal Medicine

## 2017-10-06 ENCOUNTER — Encounter: Payer: Self-pay | Admitting: Internal Medicine

## 2017-10-27 DIAGNOSIS — L720 Epidermal cyst: Secondary | ICD-10-CM | POA: Diagnosis not present

## 2017-11-16 ENCOUNTER — Ambulatory Visit (AMBULATORY_SURGERY_CENTER): Payer: Self-pay | Admitting: *Deleted

## 2017-11-16 ENCOUNTER — Other Ambulatory Visit: Payer: Self-pay

## 2017-11-16 VITALS — Ht 74.0 in | Wt 191.4 lb

## 2017-11-16 DIAGNOSIS — Z86010 Personal history of colonic polyps: Secondary | ICD-10-CM

## 2017-11-16 MED ORDER — SUPREP BOWEL PREP KIT 17.5-3.13-1.6 GM/177ML PO SOLN: 1.0000 | Freq: Once | ORAL | 0 refills | Status: AC

## 2017-11-16 NOTE — Progress Notes (Signed)
Patient denies any allergies to egg or soy products. Patient denies complications with anesthesia/sedation.  Patient denies oxygen use at home and denies diet medications. Patient denies information on colonoscopy. 

## 2017-12-01 ENCOUNTER — Encounter: Payer: Self-pay | Admitting: Internal Medicine

## 2017-12-01 ENCOUNTER — Ambulatory Visit (AMBULATORY_SURGERY_CENTER): Payer: Medicare Other | Admitting: Internal Medicine

## 2017-12-01 ENCOUNTER — Other Ambulatory Visit: Payer: Self-pay

## 2017-12-01 VITALS — BP 112/74 | HR 49 | Temp 95.9°F | Resp 16 | Ht 74.0 in | Wt 191.0 lb

## 2017-12-01 DIAGNOSIS — Z86010 Personal history of colonic polyps: Secondary | ICD-10-CM | POA: Diagnosis not present

## 2017-12-01 DIAGNOSIS — K219 Gastro-esophageal reflux disease without esophagitis: Secondary | ICD-10-CM | POA: Diagnosis not present

## 2017-12-01 MED ORDER — SODIUM CHLORIDE 0.9 % IV SOLN: 500.0000 mL | Freq: Once | INTRAVENOUS | Status: DC

## 2017-12-01 NOTE — Progress Notes (Signed)
Pt's states no medical or surgical changes since previsit or office visit. 

## 2017-12-01 NOTE — Progress Notes (Signed)
Report given to PACU, vss 

## 2017-12-01 NOTE — Op Note (Signed)
[facility] Patient Name: Austin Graves Procedure Date: 12/01/2017 11:29 AM MRN: [id number] Endoscopist: Wilhemina Bonito. Marina Goodell , MD Age: 72 Referring MD:  Date of Birth: 1946/05/10 Gender: Male Account #: [id number] Procedure:                Colonoscopy Indications:              High risk colon cancer surveillance: Personal                            history of non-advanced adenoma Previous                            examinations 2005 and 2012 Medicines:                Monitored Anesthesia Care Procedure:                Pre-Anesthesia Assessment:                           - Prior to the procedure, a History and Physical                            was performed, and patient medications and                            allergies were reviewed. The patient's tolerance of                            previous anesthesia was also reviewed. The risks                            and benefits of the procedure and the sedation                            options and risks were discussed with the patient.                            All questions were answered, and informed consent                            was obtained. Prior Anticoagulants: The patient has                            taken no previous anticoagulant or antiplatelet                            agents. ASA Grade Assessment: I - A normal, healthy                            patient. After reviewing the risks and benefits,                            the patient was deemed in satisfactory condition to  undergo the procedure.                           After obtaining informed consent, the colonoscope                            was passed under direct vision. Throughout the                            procedure, the patient's blood pressure, pulse, and                            oxygen saturations were monitored continuously. The                            Colonoscope was introduced through the anus and                 advanced to the the cecum, identified by                            appendiceal orifice and ileocecal valve. The                            ileocecal valve, appendiceal orifice, and rectum                            were photographed. The quality of the bowel                            preparation was excellent. The colonoscopy was                            performed without difficulty. The patient tolerated                            the procedure well. The bowel preparation used was                            SUPREP. Scope In: 11:38:43 AM Scope Out: 11:48:15 AM Scope Withdrawal Time: 0 hours 7 minutes 31 seconds  Total Procedure Duration: 0 hours 9 minutes 32 seconds  Findings:                 The entire examined colon appeared normal on direct                            and retroflexion views. Complications:            No immediate complications. Estimated blood loss:                            None. Estimated Blood Loss:     Estimated blood loss: none. Impression:               - The entire examined colon is normal on direct and  retroflexion views.                           - No specimens collected. Recommendation:           - Repeat colonoscopy in 10 years for surveillance.                           - Patient has a contact number available for                            emergencies. The signs and symptoms of potential                            delayed complications were discussed with the                            patient. Return to normal activities tomorrow.                            Written discharge instructions were provided to the                            patient.                           - Resume previous diet.                           - Continue present medications. Wilhemina Bonito. Marina Goodell, MD 12/01/2017 11:52:54 AM This report has been signed electronically.

## 2017-12-01 NOTE — Patient Instructions (Signed)
Discharge instructions given. Normal exam. Resume previous medications. YOU HAD AN ENDOSCOPIC PROCEDURE TODAY AT THE [facility]:   Refer to the procedure report that was given to you for any specific questions about what was found during the examination.  If the procedure report does not answer your questions, please call your gastroenterologist to clarify.  If you requested that your care partner not be given the details of your procedure findings, then the procedure report has been included in a sealed envelope for you to review at your convenience later.  YOU SHOULD EXPECT: Some feelings of bloating in the abdomen. Passage of more gas than usual.  Walking can help get rid of the air that was put into your GI tract during the procedure and reduce the bloating. If you had a lower endoscopy (such as a colonoscopy or flexible sigmoidoscopy) you may notice spotting of blood in your stool or on the toilet paper. If you underwent a bowel prep for your procedure, you may not have a normal bowel movement for a few days.  Please Note:  You might notice some irritation and congestion in your nose or some drainage.  This is from the oxygen used during your procedure.  There is no need for concern and it should clear up in a day or so.  SYMPTOMS TO REPORT IMMEDIATELY:   Following lower endoscopy (colonoscopy or flexible sigmoidoscopy):  Excessive amounts of blood in the stool  Significant tenderness or worsening of abdominal pains  Swelling of the abdomen that is new, acute  Fever of 100F or higher   For urgent or emergent issues, a gastroenterologist can be reached at any hour by calling [phone].   DIET:  We do recommend a small meal at first, but then you may proceed to your regular diet.  Drink plenty of fluids but you should avoid alcoholic beverages for 24 hours.  ACTIVITY:  You should plan to take it easy for the rest of today and you should NOT DRIVE or use heavy machinery  until tomorrow (because of the sedation medicines used during the test).    FOLLOW UP: Our staff will call the number listed on your records the next business day following your procedure to check on you and address any questions or concerns that you may have regarding the information given to you following your procedure. If we do not reach you, we will leave a message.  However, if you are feeling well and you are not experiencing any problems, there is no need to return our call.  We will assume that you have returned to your regular daily activities without incident.  If any biopsies were taken you will be contacted by phone or by letter within the next 1-3 weeks.  Please call us at [phone] if you have not heard about the biopsies in 3 weeks.    SIGNATURES/CONFIDENTIALITY: You and/or your care partner have signed paperwork which will be entered into your electronic medical record.  These signatures attest to the fact that that the information above on your After Visit Summary has been reviewed and is understood.  Full responsibility of the confidentiality of this discharge information lies with you and/or your care-partner. 

## 2017-12-02 ENCOUNTER — Telehealth: Payer: Self-pay | Admitting: *Deleted

## 2017-12-02 NOTE — Telephone Encounter (Signed)
  Follow up Call-  Call back number 12/01/2017  Post procedure Call Back phone  # [phone]  Permission to leave phone message Yes  Some recent data might be hidden     Patient questions:  Do you have a fever, pain , or abdominal swelling? No. Pain Score  0 *  Have you tolerated food without any problems? Yes.    Have you been able to return to your normal activities? Yes.    Do you have any questions about your discharge instructions: Diet   No. Medications  No. Follow up visit  No.  Do you have questions or concerns about your Care? No.  Actions: * If pain score is 4 or above: No action needed, pain <4.

## 2018-01-04 ENCOUNTER — Ambulatory Visit
Admission: RE | Admit: 2018-01-04 | Discharge: 2018-01-04 | Disposition: A | Discharge status: Home / Self Care | Payer: Medicare Other | Source: Ambulatory Visit | Attending: Internal Medicine | Admitting: Internal Medicine

## 2018-01-04 ENCOUNTER — Other Ambulatory Visit: Payer: Self-pay | Admitting: Internal Medicine

## 2018-01-04 DIAGNOSIS — R0789 Other chest pain: Secondary | ICD-10-CM

## 2018-01-04 DIAGNOSIS — R079 Chest pain, unspecified: Secondary | ICD-10-CM | POA: Diagnosis not present

## 2018-01-18 DIAGNOSIS — Z1389 Encounter for screening for other disorder: Secondary | ICD-10-CM | POA: Diagnosis not present

## 2018-01-18 DIAGNOSIS — H9193 Unspecified hearing loss, bilateral: Secondary | ICD-10-CM | POA: Diagnosis not present

## 2018-01-18 DIAGNOSIS — R0789 Other chest pain: Secondary | ICD-10-CM | POA: Diagnosis not present

## 2018-01-18 DIAGNOSIS — Z79899 Other long term (current) drug therapy: Secondary | ICD-10-CM | POA: Diagnosis not present

## 2018-01-18 DIAGNOSIS — Z136 Encounter for screening for cardiovascular disorders: Secondary | ICD-10-CM | POA: Diagnosis not present

## 2018-01-18 DIAGNOSIS — E559 Vitamin D deficiency, unspecified: Secondary | ICD-10-CM | POA: Diagnosis not present

## 2018-01-18 DIAGNOSIS — Z125 Encounter for screening for malignant neoplasm of prostate: Secondary | ICD-10-CM | POA: Diagnosis not present

## 2018-01-18 DIAGNOSIS — K219 Gastro-esophageal reflux disease without esophagitis: Secondary | ICD-10-CM | POA: Diagnosis not present

## 2018-01-18 DIAGNOSIS — Z0001 Encounter for general adult medical examination with abnormal findings: Secondary | ICD-10-CM | POA: Diagnosis not present

## 2018-02-16 DIAGNOSIS — M25512 Pain in left shoulder: Secondary | ICD-10-CM | POA: Diagnosis not present

## 2018-02-16 DIAGNOSIS — M542 Cervicalgia: Secondary | ICD-10-CM | POA: Diagnosis not present

## 2018-02-16 DIAGNOSIS — S43102D Unspecified dislocation of left acromioclavicular joint, subsequent encounter: Secondary | ICD-10-CM | POA: Diagnosis not present

## 2018-02-16 DIAGNOSIS — S43102A Unspecified dislocation of left acromioclavicular joint, initial encounter: Secondary | ICD-10-CM | POA: Diagnosis not present

## 2018-04-05 DIAGNOSIS — R000 Tachycardia, unspecified: Secondary | ICD-10-CM | POA: Diagnosis not present

## 2018-04-18 ENCOUNTER — Other Ambulatory Visit (HOSPITAL_COMMUNITY): Payer: Self-pay | Admitting: Internal Medicine

## 2018-04-18 DIAGNOSIS — I471 Supraventricular tachycardia: Secondary | ICD-10-CM

## 2018-04-18 DIAGNOSIS — R000 Tachycardia, unspecified: Secondary | ICD-10-CM

## 2018-04-19 ENCOUNTER — Other Ambulatory Visit: Payer: Self-pay

## 2018-04-19 ENCOUNTER — Ambulatory Visit (HOSPITAL_COMMUNITY): Payer: Medicare Other | Attending: Cardiology

## 2018-04-19 DIAGNOSIS — R000 Tachycardia, unspecified: Secondary | ICD-10-CM | POA: Diagnosis not present

## 2018-04-19 DIAGNOSIS — I519 Heart disease, unspecified: Secondary | ICD-10-CM | POA: Diagnosis not present

## 2018-04-19 DIAGNOSIS — I471 Supraventricular tachycardia: Secondary | ICD-10-CM

## 2018-04-20 DIAGNOSIS — R000 Tachycardia, unspecified: Secondary | ICD-10-CM | POA: Diagnosis not present

## 2018-04-25 DIAGNOSIS — R000 Tachycardia, unspecified: Secondary | ICD-10-CM | POA: Diagnosis not present

## 2018-04-25 DIAGNOSIS — I471 Supraventricular tachycardia: Secondary | ICD-10-CM | POA: Diagnosis not present

## 2018-04-26 DIAGNOSIS — I471 Supraventricular tachycardia: Secondary | ICD-10-CM | POA: Diagnosis not present

## 2018-05-01 DIAGNOSIS — Z23 Encounter for immunization: Secondary | ICD-10-CM | POA: Diagnosis not present

## 2018-05-02 ENCOUNTER — Other Ambulatory Visit (HOSPITAL_COMMUNITY): Payer: Medicare Other

## 2018-05-06 DIAGNOSIS — I471 Supraventricular tachycardia: Secondary | ICD-10-CM | POA: Diagnosis not present

## 2018-06-20 DIAGNOSIS — I471 Supraventricular tachycardia: Secondary | ICD-10-CM | POA: Diagnosis not present

## 2018-10-24 ENCOUNTER — Ambulatory Visit: Payer: Self-pay | Admitting: Cardiology

## 2018-12-31 DIAGNOSIS — I471 Supraventricular tachycardia: Secondary | ICD-10-CM | POA: Insufficient documentation
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
## 2019-01-02 ENCOUNTER — Ambulatory Visit (INDEPENDENT_AMBULATORY_CARE_PROVIDER_SITE_OTHER): Payer: Medicare Other | Admitting: Cardiology

## 2019-01-02 ENCOUNTER — Encounter: Payer: Self-pay | Admitting: Cardiology

## 2019-01-02 ENCOUNTER — Other Ambulatory Visit: Payer: Self-pay

## 2019-01-02 VITALS — BP 123/76 | HR 58 | Ht 74.0 in | Wt 174.0 lb

## 2019-01-02 DIAGNOSIS — I471 Supraventricular tachycardia: Secondary | ICD-10-CM

## 2019-01-02 DIAGNOSIS — I351 Nonrheumatic aortic (valve) insufficiency: Secondary | ICD-10-CM | POA: Diagnosis not present

## 2019-03-30 DIAGNOSIS — Z23 Encounter for immunization: Secondary | ICD-10-CM | POA: Diagnosis not present

## 2019-04-17 DIAGNOSIS — Z13220 Encounter for screening for lipoid disorders: Secondary | ICD-10-CM | POA: Diagnosis not present

## 2019-04-17 DIAGNOSIS — H9193 Unspecified hearing loss, bilateral: Secondary | ICD-10-CM | POA: Diagnosis not present

## 2019-04-17 DIAGNOSIS — R002 Palpitations: Secondary | ICD-10-CM | POA: Diagnosis not present

## 2019-04-17 DIAGNOSIS — L820 Inflamed seborrheic keratosis: Secondary | ICD-10-CM | POA: Diagnosis not present

## 2019-04-17 DIAGNOSIS — Z79899 Other long term (current) drug therapy: Secondary | ICD-10-CM | POA: Diagnosis not present

## 2019-04-17 DIAGNOSIS — Z125 Encounter for screening for malignant neoplasm of prostate: Secondary | ICD-10-CM | POA: Diagnosis not present

## 2019-04-17 DIAGNOSIS — Z0001 Encounter for general adult medical examination with abnormal findings: Secondary | ICD-10-CM | POA: Diagnosis not present

## 2019-04-17 DIAGNOSIS — Z1389 Encounter for screening for other disorder: Secondary | ICD-10-CM | POA: Diagnosis not present

## 2019-04-17 DIAGNOSIS — R351 Nocturia: Secondary | ICD-10-CM | POA: Diagnosis not present

## 2019-04-17 DIAGNOSIS — R350 Frequency of micturition: Secondary | ICD-10-CM | POA: Diagnosis not present

## 2019-06-21 ENCOUNTER — Other Ambulatory Visit: Payer: Self-pay

## 2019-06-21 ENCOUNTER — Ambulatory Visit (INDEPENDENT_AMBULATORY_CARE_PROVIDER_SITE_OTHER): Payer: Medicare Other

## 2019-06-21 DIAGNOSIS — I351 Nonrheumatic aortic (valve) insufficiency: Secondary | ICD-10-CM | POA: Diagnosis not present

## 2019-06-22 DIAGNOSIS — H903 Sensorineural hearing loss, bilateral: Secondary | ICD-10-CM | POA: Diagnosis not present

## 2019-06-27 ENCOUNTER — Ambulatory Visit: Payer: Medicare Other | Admitting: Cardiology

## 2019-06-29 ENCOUNTER — Encounter: Payer: Self-pay | Admitting: Cardiology

## 2019-06-29 ENCOUNTER — Other Ambulatory Visit: Payer: Self-pay

## 2019-06-29 ENCOUNTER — Ambulatory Visit (INDEPENDENT_AMBULATORY_CARE_PROVIDER_SITE_OTHER): Payer: Medicare Other | Admitting: Cardiology

## 2019-06-29 VITALS — BP 160/90 | HR 58 | Temp 98.3°F | Ht 74.0 in | Wt 184.0 lb

## 2019-06-29 DIAGNOSIS — I471 Supraventricular tachycardia: Secondary | ICD-10-CM | POA: Diagnosis not present

## 2019-06-29 DIAGNOSIS — R030 Elevated blood-pressure reading, without diagnosis of hypertension: Secondary | ICD-10-CM

## 2019-06-29 NOTE — Progress Notes (Signed)
Follow up visit  Subjective:   Austin Graves, male    DOB: 1945-09-12, 73 y.o.   MRN: [id number]    HPI  73 y.o. Caucasian male with h/o SVT, mild aortic root dilatation.  Patient previously managed by Dr. Sherril Croon, who is now retired. Previous workup had showed negative exercise treadmill stress test, SVT on event monitor, aortic root 39 mm.   Patient is doing well. He is a retiring Pensions consultant. He stays very active with biking and other exercise. He denies chest pain, shortness of breath, palpitations, leg edema, orthopnea, PND, TIA/syncope. He has only taken diltiazem for prn use for SVT once.   Blood pressure is elevated today. He checks it regularly at home and reports that it is always <130s/80s, but higher at doctors visits.    Current Outpatient Medications on File Prior to Visit  Medication Sig Dispense Refill  . Calcium Carbonate (CALCIUM 600 PO) Take by mouth daily.    . Cholecalciferol (VITAMIN D3 PO) Take by mouth daily.    Marland Kitchen ibuprofen (ADVIL,MOTRIN) 400 MG tablet Take 400 mg by mouth every 6 (six) hours as needed for headache.    Marland Kitchen LYSINE PO Take 1 Dose by mouth daily.    Marland Kitchen omeprazole (PRILOSEC) 20 MG capsule Take 20 mg by mouth every Monday, Wednesday, and Friday.     No current facility-administered medications on file prior to visit.    Cardiovascular & other pertient studies:   EKG 06/29/2019: Sinus rhythm 59 bpm. Normal EKG.  Echocardiogram 06/21/2019: Left ventricle cavity is normal in size. Mild concentric hypertrophy of the left ventricle. Normal LV systolic function with EF 57%. Normal global wall motion. Normal diastolic filling pattern. 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  Trileaflet aortic valve. Mild (Grade I) aortic regurgitation. Mild (Grade I) mitral regurgitation. 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 Estimated RA pressure 8 mmHg. Aortic root 3.8 cm  Treadmill exercise stress test  05/06/2018: Indication: SVT The patient exercised on Bruce protocol for 12:57 min. Patient achieved 14.13 METS and reached HR 158 bpm, which is 104 % of maximum age-predicted HR. Stress test terminated due to fatigue. Resting EKG demonstrates Normal sinus rhythm. ST Changes: With peak exercise there was no ST-T changes of ischemia. Arrhythmias: none. BP Response to Exercise: Normal resting BP- exaggerated response. Resting 118/74 and max 120/102 in recovery at 1 mnute. ST Changes: With peak exercise there was no ST-T changes of ischemia. HR Response to Exercise: Appropriate. Exercise capacity was above average for age. Recommendations: Continue primary/secondary prevention.  Echocardiogram 04/19/2018: - Left ventricle: The cavity size was normal. Systolic function was   normal. The estimated ejection fraction was in the range of 60%   to 65%. Wall motion was normal; there were no regional wall   motion abnormalities. Doppler parameters are consistent with   abnormal left ventricular relaxation (grade 1 diastolic   dysfunction). - Aortic valve: Trileaflet; mildly thickened, mildly calcified   leaflets. There was trivial regurgitation. - Aorta: Aortic root dimension: 39 mm (ED). - Ascending aorta: The ascending aorta was mildly dilated.  Cardiac monitoring for 10 days at [facility] office in September 2019-sinus rhythm, average heart rate-61 per minute, range-44-155/m. There were rare PVCs and PACs. Total 13 runs of SVT, longest run lasted for 17 beats at average heart rate 100/m. Fastest run consisted of 4 beats with maximum heart rate-154/m.   Recent labs: Not available    Review of Systems  Constitution: Negative for  decreased appetite, malaise/fatigue, weight gain and weight loss.  HENT: Negative for congestion.   Eyes: Negative for visual disturbance.  Cardiovascular: Negative for chest pain, dyspnea on exertion, leg swelling, palpitations and syncope.  Respiratory: Negative for cough.    Endocrine: Negative for cold intolerance.  Hematologic/Lymphatic: Does not bruise/bleed easily.  Skin: Negative for itching and rash.  Musculoskeletal: Negative for myalgias.  Gastrointestinal: Negative for abdominal pain, nausea and vomiting.  Genitourinary: Negative for dysuria.  Neurological: Negative for dizziness and weakness.  Psychiatric/Behavioral: The patient is not nervous/anxious.   All other systems reviewed and are negative.        Vitals:   06/29/19 1418 06/29/19 1429  BP: (!) 158/88 (!) 158/91  Pulse: 62 61  Temp: 98.3 F (36.8 C)   SpO2: 99%     Body mass index is 23.62 kg/m. Filed Weights   06/29/19 1418  Weight: 184 lb (83.5 kg)     Objective:   Physical Exam  Constitutional: He is oriented to person, place, and time. He appears well-developed and well-nourished. No distress.  HENT:  Head: Normocephalic and atraumatic.  Eyes: Pupils are equal, round, and reactive to light. Conjunctivae are normal.  Neck: No JVD present.  Cardiovascular: Normal rate, regular rhythm and intact distal pulses.  No murmur heard. Pulmonary/Chest: Effort normal and breath sounds normal. He has no wheezes. He has no rales.  Abdominal: Soft. Bowel sounds are normal. There is no rebound.  Musculoskeletal:        General: No edema.  Lymphadenopathy:    He has no cervical adenopathy.  Neurological: He is alert and oriented to person, place, and time. No cranial nerve deficit.  Skin: Skin is warm and dry.  Psychiatric: He has a normal mood and affect.  Nursing note and vitals reviewed.         Assessment & Recommendations:   73 y.o. Caucasian male with h/o SVT, mild aortic root dilatation.  Reviewed echocardiogram with the patient. I think his aortic root is normal for his size. He has not had any recurrent SVT. Blood pressure elevated today. Recommend regular home monitoring and return BP check visiti in 4 weeks. If remains >140/90, will start amlodipine 5 mg daily.      Nigel Mormon, MD [facility] Pager: [phone] Office: [phone]

## 2019-06-30 ENCOUNTER — Encounter: Payer: Self-pay | Admitting: Cardiology

## 2019-06-30 DIAGNOSIS — R030 Elevated blood-pressure reading, without diagnosis of hypertension: Secondary | ICD-10-CM | POA: Insufficient documentation

## 2019-07-31 ENCOUNTER — Other Ambulatory Visit: Payer: Self-pay

## 2019-07-31 ENCOUNTER — Ambulatory Visit (INDEPENDENT_AMBULATORY_CARE_PROVIDER_SITE_OTHER): Payer: Medicare Other | Admitting: Cardiology

## 2019-07-31 ENCOUNTER — Encounter: Payer: Self-pay | Admitting: Cardiology

## 2019-07-31 VITALS — BP 145/94 | HR 58 | Ht 74.0 in | Wt 176.0 lb

## 2019-07-31 DIAGNOSIS — R030 Elevated blood-pressure reading, without diagnosis of hypertension: Secondary | ICD-10-CM

## 2019-07-31 NOTE — Progress Notes (Signed)
BP log:

## 2019-07-31 NOTE — Progress Notes (Signed)
74 y.o. Caucasian male with h/o SVT, mild aortic root dilatation.  Vitals:   07/31/19 1150 07/31/19 1151  BP: (!) 150/91 (!) 145/94  Pulse:  (!) 58  SpO2:     Physical Exam  Constitutional: He appears well-developed and well-nourished.  Musculoskeletal:        General: No edema.  Nursing note and vitals reviewed.  BO lower on home BP log with a few outliers. Conintue home monitoring for now. If BP consistently stays ?140/85 mmHg, will add amlodipine 5 mg daily.  Elder Negus, MD [facility] Pager: [phone] Office: [phone]

## 2019-08-18 ENCOUNTER — Ambulatory Visit: Payer: Medicare Other

## 2019-08-26 ENCOUNTER — Ambulatory Visit: Payer: Medicare Other

## 2019-08-29 ENCOUNTER — Ambulatory Visit: Payer: Medicare Other

## 2020-03-22 DIAGNOSIS — Z23 Encounter for immunization: Secondary | ICD-10-CM | POA: Diagnosis not present

## 2020-05-02 DIAGNOSIS — Z1389 Encounter for screening for other disorder: Secondary | ICD-10-CM | POA: Diagnosis not present

## 2020-05-02 DIAGNOSIS — Z79899 Other long term (current) drug therapy: Secondary | ICD-10-CM | POA: Diagnosis not present

## 2020-05-02 DIAGNOSIS — R002 Palpitations: Secondary | ICD-10-CM | POA: Diagnosis not present

## 2020-05-02 DIAGNOSIS — Z7189 Other specified counseling: Secondary | ICD-10-CM | POA: Diagnosis not present

## 2020-05-02 DIAGNOSIS — Z23 Encounter for immunization: Secondary | ICD-10-CM | POA: Diagnosis not present

## 2020-05-02 DIAGNOSIS — Z125 Encounter for screening for malignant neoplasm of prostate: Secondary | ICD-10-CM | POA: Diagnosis not present

## 2020-05-02 DIAGNOSIS — E559 Vitamin D deficiency, unspecified: Secondary | ICD-10-CM | POA: Diagnosis not present

## 2020-05-02 DIAGNOSIS — R350 Frequency of micturition: Secondary | ICD-10-CM | POA: Diagnosis not present

## 2020-05-02 DIAGNOSIS — R351 Nocturia: Secondary | ICD-10-CM | POA: Diagnosis not present

## 2020-05-02 DIAGNOSIS — Z0000 Encounter for general adult medical examination without abnormal findings: Secondary | ICD-10-CM | POA: Diagnosis not present

## 2020-05-02 DIAGNOSIS — Z13220 Encounter for screening for lipoid disorders: Secondary | ICD-10-CM | POA: Diagnosis not present

## 2020-05-02 DIAGNOSIS — H9193 Unspecified hearing loss, bilateral: Secondary | ICD-10-CM | POA: Diagnosis not present

## 2020-05-16 DIAGNOSIS — Z23 Encounter for immunization: Secondary | ICD-10-CM | POA: Diagnosis not present

## 2020-08-01 ENCOUNTER — Encounter: Payer: Self-pay | Admitting: Cardiology

## 2020-08-01 ENCOUNTER — Ambulatory Visit: Payer: Medicare Other | Admitting: Cardiology

## 2020-08-01 ENCOUNTER — Other Ambulatory Visit: Payer: Self-pay

## 2020-08-01 VITALS — BP 146/77 | HR 64 | Resp 16 | Ht 74.0 in | Wt 186.0 lb

## 2020-08-01 DIAGNOSIS — E782 Mixed hyperlipidemia: Secondary | ICD-10-CM | POA: Insufficient documentation

## 2020-08-01 DIAGNOSIS — I471 Supraventricular tachycardia: Secondary | ICD-10-CM

## 2020-08-01 NOTE — Progress Notes (Signed)
Follow up visit  Subjective:   Austin Graves, male    DOB: October 20, 1945, 75 y.o.   MRN: [id number]    HPI  75 y.o. Caucasian male with h/o SVT  Patient is doing well. He rode [place] miles in year 2021. He denies chest pain, shortness of breath, palpitations, leg edema, orthopnea, PND, TIA/syncope. BP elevated today, but very well controlled, as evident on home BP log. Reviewed recent external lab results with the patient, details below.    Current Outpatient Medications on File Prior to Visit  Medication Sig Dispense Refill  . Calcium Carbonate (CALCIUM 600 PO) Take by mouth daily.    . Cholecalciferol (VITAMIN D3 PO) Take by mouth daily.    Marland Kitchen ibuprofen (ADVIL,MOTRIN) 400 MG tablet Take 400 mg by mouth every 6 (six) hours as needed for headache.    Marland Kitchen LYSINE PO Take 1 Dose by mouth daily.    Marland Kitchen omeprazole (PRILOSEC) 20 MG capsule Take 20 mg by mouth every Monday, Wednesday, and Friday.     No current facility-administered medications on file prior to visit.    Cardiovascular & other pertient studies:  EKG 08/01/2020: Sinus rhythm 60 bpm  Normal EKG  Echocardiogram 06/21/2019: Left ventricle cavity is normal in size. Mild concentric hypertrophy of the left ventricle. Normal LV systolic function with EF 57%. Normal global wall motion. Normal diastolic filling pattern. 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  Trileaflet aortic valve. Mild (Grade I) aortic regurgitation. Mild (Grade I) mitral regurgitation. 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 Estimated RA pressure 8 mmHg. Aortic root 3.8 cm  Treadmill exercise stress test 05/06/2018: Indication: SVT The patient exercised on Bruce protocol for 12:57 min. Patient achieved 14.13 METS and reached HR 158 bpm, which is 104 % of maximum age-predicted HR. Stress test terminated due to fatigue. Resting EKG demonstrates Normal sinus rhythm. ST Changes: With peak exercise there was no  ST-T changes of ischemia. Arrhythmias: none. BP Response to Exercise: Normal resting BP- exaggerated response. Resting 118/74 and max 120/102 in recovery at 1 mnute. ST Changes: With peak exercise there was no ST-T changes of ischemia. HR Response to Exercise: Appropriate. Exercise capacity was above average for age. Recommendations: Continue primary/secondary prevention.  Cardiac monitoring for 10 days at [facility] office in September 2019-sinus rhythm, average heart rate-61 per minute, range-44-155/m. There were rare PVCs and PACs. Total 13 runs of SVT, longest run lasted for 17 beats at average heart rate 100/m. Fastest run consisted of 4 beats with maximum heart rate-154/m.   Recent labs: 05/02/2020: Glucose 99, BUN/Cr 24/0.9. EGFR N/A. HbA1C N/A Chol 157, TG 72, HDL 58, LDL 86 TSH 1.4 normal     Review of Systems  Cardiovascular: Negative for chest pain, dyspnea on exertion, leg swelling, palpitations and syncope.         Vitals:   08/01/20 0948  BP: (!) 146/77  Pulse: 64  Resp: 16  SpO2: 97%     Body mass index is 23.88 kg/m. Filed Weights   08/01/20 0948  Weight: 186 lb (84.4 kg)     Objective:   Physical Exam Vitals and nursing note reviewed.  Constitutional:      General: He is not in acute distress. Neck:     Vascular: No JVD.  Cardiovascular:     Rate and Rhythm: Normal rate and regular rhythm.     Heart sounds: Normal heart sounds. No murmur heard.   Pulmonary:  Effort: Pulmonary effort is normal.     Breath sounds: Normal breath sounds. No wheezing or rales.           Assessment & Recommendations:   75 y.o. Caucasian male with h/o SVT, mixed hyperlipidemia  PSVT: No recurrence  Patient is essentially asymptomatic at this point. I will see him on as needed basis.   Nigel Mormon, MD [facility] Pager: [phone] Office: [phone]

## 2020-09-13 DIAGNOSIS — H8110 Benign paroxysmal vertigo, unspecified ear: Secondary | ICD-10-CM | POA: Diagnosis not present

## 2020-09-13 DIAGNOSIS — M778 Other enthesopathies, not elsewhere classified: Secondary | ICD-10-CM | POA: Diagnosis not present

## 2020-09-13 DIAGNOSIS — H6122 Impacted cerumen, left ear: Secondary | ICD-10-CM | POA: Diagnosis not present

## 2020-09-21 ENCOUNTER — Encounter (HOSPITAL_BASED_OUTPATIENT_CLINIC_OR_DEPARTMENT_OTHER): Payer: Self-pay | Admitting: Emergency Medicine

## 2020-09-21 ENCOUNTER — Other Ambulatory Visit: Payer: Self-pay

## 2020-09-21 ENCOUNTER — Emergency Department (HOSPITAL_BASED_OUTPATIENT_CLINIC_OR_DEPARTMENT_OTHER): Payer: Medicare Other

## 2020-09-21 ENCOUNTER — Emergency Department (HOSPITAL_BASED_OUTPATIENT_CLINIC_OR_DEPARTMENT_OTHER)
Admission: EM | Admit: 2020-09-21 | Discharge: 2020-09-21 | Disposition: A | Discharge status: Home / Self Care | Payer: Medicare Other | Attending: Emergency Medicine | Admitting: Emergency Medicine

## 2020-09-21 DIAGNOSIS — R1012 Left upper quadrant pain: Secondary | ICD-10-CM | POA: Diagnosis not present

## 2020-09-21 DIAGNOSIS — R1010 Upper abdominal pain, unspecified: Secondary | ICD-10-CM | POA: Diagnosis present

## 2020-09-21 DIAGNOSIS — R9431 Abnormal electrocardiogram [ECG] [EKG]: Secondary | ICD-10-CM | POA: Diagnosis not present

## 2020-09-21 DIAGNOSIS — I700 Atherosclerosis of aorta: Secondary | ICD-10-CM | POA: Diagnosis not present

## 2020-09-21 DIAGNOSIS — K219 Gastro-esophageal reflux disease without esophagitis: Secondary | ICD-10-CM | POA: Diagnosis not present

## 2020-09-21 DIAGNOSIS — K429 Umbilical hernia without obstruction or gangrene: Secondary | ICD-10-CM | POA: Diagnosis not present

## 2020-09-21 DIAGNOSIS — R42 Dizziness and giddiness: Secondary | ICD-10-CM | POA: Diagnosis not present

## 2020-09-21 LAB — CBC WITH DIFFERENTIAL/PLATELET
Abs Immature Granulocytes: 0.03 10*3/uL (ref 0.00–0.07)
Basophils Absolute: 0.1 10*3/uL (ref 0.0–0.1)
Basophils Relative: 1 %
Eosinophils Absolute: 0 10*3/uL (ref 0.0–0.5)
Eosinophils Relative: 0 %
HCT: 43.1 % (ref 39.0–52.0)
Hemoglobin: 14.5 g/dL (ref 13.0–17.0)
Immature Granulocytes: 0 %
Lymphocytes Relative: 7 %
Lymphs Abs: 0.6 10*3/uL — ABNORMAL LOW (ref 0.7–4.0)
MCH: 32.4 pg (ref 26.0–34.0)
MCHC: 33.6 g/dL (ref 30.0–36.0)
MCV: 96.4 fL (ref 80.0–100.0)
Monocytes Absolute: 0.5 10*3/uL (ref 0.1–1.0)
Monocytes Relative: 5 %
Neutro Abs: 8.4 10*3/uL — ABNORMAL HIGH (ref 1.7–7.7)
Neutrophils Relative %: 87 %
Platelets: 203 10*3/uL (ref 150–400)
RBC: 4.47 MIL/uL (ref 4.22–5.81)
RDW: 13 % (ref 11.5–15.5)
WBC: 9.6 10*3/uL (ref 4.0–10.5)
nRBC: 0 % (ref 0.0–0.2)

## 2020-09-21 LAB — COMPREHENSIVE METABOLIC PANEL
ALT: 15 U/L (ref 0–44)
AST: 19 U/L (ref 15–41)
Albumin: 4.2 g/dL (ref 3.5–5.0)
Alkaline Phosphatase: 56 U/L (ref 38–126)
Anion gap: 10 (ref 5–15)
BUN: 20 mg/dL (ref 8–23)
CO2: 25 mmol/L (ref 22–32)
Calcium: 9.5 mg/dL (ref 8.9–10.3)
Chloride: 101 mmol/L (ref 98–111)
Creatinine, Ser: 0.76 mg/dL (ref 0.61–1.24)
GFR, Estimated: >60 mL/min (ref >60)
Glucose, Bld: 123 mg/dL — ABNORMAL HIGH (ref 70–99)
Potassium: 5 mmol/L (ref 3.5–5.1)
Sodium: 136 mmol/L (ref 135–145)
Total Bilirubin: 0.9 mg/dL (ref 0.3–1.2)
Total Protein: 6.7 g/dL (ref 6.5–8.1)

## 2020-09-21 LAB — TROPONIN I (HIGH SENSITIVITY): Troponin I (High Sensitivity): 11 ng/L (ref <18)

## 2020-09-21 MED ORDER — OXYCODONE-ACETAMINOPHEN 5-325 MG PO TABS
1.0000 | ORAL_TABLET | Freq: Once | ORAL | Status: AC
  Administered 2020-09-21: 1 via ORAL
  Filled 2020-09-21: qty 1

## 2020-09-21 MED ORDER — IOHEXOL 300 MG/ML  SOLN
100.0000 mL | Freq: Once | INTRAMUSCULAR | Status: AC | PRN
  Administered 2020-09-21: 100 mL via INTRAVENOUS

## 2020-09-21 MED ORDER — ALUM & MAG HYDROXIDE-SIMETH 200-200-20 MG/5ML PO SUSP
30.0000 mL | Freq: Once | ORAL | Status: AC
  Administered 2020-09-21: 30 mL via ORAL
  Filled 2020-09-21: qty 30

## 2020-09-21 MED ORDER — LIDOCAINE VISCOUS HCL 2 % MT SOLN
15.0000 mL | Freq: Once | OROMUCOSAL | Status: AC
  Administered 2020-09-21: 15 mL via ORAL
  Filled 2020-09-21: qty 15
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
## 2020-09-21 NOTE — Discharge Instructions (Signed)
Recommend Tylenol or Motrin for pain control.  Follow-up with your primary doctor next week for recheck.  If you have worsening pain, vomiting, any chest pain or difficulty in breathing, return to ER for reassessment.

## 2020-09-24 ENCOUNTER — Other Ambulatory Visit: Payer: Self-pay | Admitting: Internal Medicine

## 2020-09-24 ENCOUNTER — Other Ambulatory Visit (HOSPITAL_COMMUNITY): Payer: Self-pay | Admitting: Internal Medicine

## 2020-09-24 DIAGNOSIS — K449 Diaphragmatic hernia without obstruction or gangrene: Secondary | ICD-10-CM | POA: Diagnosis not present

## 2020-09-24 DIAGNOSIS — R1011 Right upper quadrant pain: Secondary | ICD-10-CM

## 2020-09-24 DIAGNOSIS — K8020 Calculus of gallbladder without cholecystitis without obstruction: Secondary | ICD-10-CM | POA: Diagnosis not present

## 2020-10-14 ENCOUNTER — Ambulatory Visit (HOSPITAL_COMMUNITY)
Admission: RE | Admit: 2020-10-14 | Discharge: 2020-10-14 | Disposition: A | Discharge status: Home / Self Care | Payer: Medicare Other | Source: Ambulatory Visit | Attending: Internal Medicine | Admitting: Internal Medicine

## 2020-10-14 ENCOUNTER — Other Ambulatory Visit: Payer: Self-pay

## 2020-10-14 DIAGNOSIS — R1011 Right upper quadrant pain: Secondary | ICD-10-CM

## 2020-10-14 DIAGNOSIS — K8020 Calculus of gallbladder without cholecystitis without obstruction: Secondary | ICD-10-CM | POA: Diagnosis not present

## 2020-10-14 MED ORDER — TECHNETIUM TC 99M MEBROFENIN IV KIT
5.5000 | PACK | Freq: Once | INTRAVENOUS | Status: AC | PRN
  Administered 2020-10-14: 5.5 via INTRAVENOUS

## 2020-11-25 ENCOUNTER — Other Ambulatory Visit: Payer: Self-pay | Admitting: Surgery

## 2020-11-25 DIAGNOSIS — K8020 Calculus of gallbladder without cholecystitis without obstruction: Secondary | ICD-10-CM | POA: Diagnosis not present

## 2020-12-19 ENCOUNTER — Other Ambulatory Visit (HOSPITAL_COMMUNITY): Payer: Medicare Other

## 2020-12-23 DIAGNOSIS — Z23 Encounter for immunization: Secondary | ICD-10-CM | POA: Diagnosis not present

## 2021-01-01 ENCOUNTER — Other Ambulatory Visit: Payer: Self-pay

## 2021-01-01 ENCOUNTER — Encounter (HOSPITAL_BASED_OUTPATIENT_CLINIC_OR_DEPARTMENT_OTHER): Payer: Self-pay | Admitting: Surgery

## 2021-01-01 MED ORDER — ENSURE PRE-SURGERY PO LIQD: 296.0000 mL | Freq: Once | ORAL | Status: DC

## 2021-01-01 NOTE — Progress Notes (Signed)

## 2021-01-06 ENCOUNTER — Other Ambulatory Visit: Payer: Self-pay

## 2021-01-06 ENCOUNTER — Encounter: Payer: Self-pay | Admitting: Cardiology

## 2021-01-06 ENCOUNTER — Ambulatory Visit: Payer: Medicare Other | Admitting: Cardiology

## 2021-01-06 VITALS — BP 143/86 | HR 67 | Temp 97.7°F | Resp 17 | Ht 74.0 in | Wt 186.0 lb

## 2021-01-06 DIAGNOSIS — I471 Supraventricular tachycardia: Secondary | ICD-10-CM

## 2021-01-06 DIAGNOSIS — I499 Cardiac arrhythmia, unspecified: Secondary | ICD-10-CM

## 2021-01-06 NOTE — Progress Notes (Signed)
Follow up visit  Subjective:   Austin Graves, male    DOB: June 08, 1946, 75 y.o.   MRN: [id number]    HPI  75 y.o. Caucasian male with h/o SVT  Patient had one episode, last week, of palpitations lasting for couple hours. He felts his heart rate was irregular. This felt similar to his prior SVT episodes.  Patient has an upcoming gallbladder surgery in first week of July.  Therefore, he wanted to get this checked before the surgery.  At baseline, he remains very active, running several miles regularly, without any complaints of chest pain or shortness of breath.  As usual, BP elevated in the office today, but very well controlled at home, as evident on home BP log.    Current Outpatient Medications on File Prior to Visit  Medication Sig Dispense Refill   Calcium Carbonate (CALCIUM 600 PO) Take by mouth daily.     Cholecalciferol (VITAMIN D3 PO) Take by mouth daily.     ibuprofen (ADVIL,MOTRIN) 400 MG tablet Take 400 mg by mouth every 6 (six) hours as needed for headache.     LYSINE PO Take 1 Dose by mouth daily.     omeprazole (PRILOSEC) 20 MG capsule Take 20 mg by mouth every Monday, Wednesday, and Friday.     Current Facility-Administered Medications on File Prior to Visit  Medication Dose Route Frequency Provider Last Rate Last Admin   feeding supplement (ENSURE PRE-SURGERY) liquid 296 mL  296 mL Oral Once Coralie Keens, MD        Cardiovascular & other pertient studies:  EKG 01/06/2021: Sinus rhythm 61 bpm  RSR(V1) -nondiagnostic  Echocardiogram 06/21/2019: Left ventricle cavity is normal in size. Mild concentric hypertrophy of the left ventricle. Normal LV systolic function with EF 57%. Normal global wall motion. Normal diastolic filling pattern. 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  Trileaflet aortic valve. Mild (Grade I) aortic regurgitation. Mild (Grade I) mitral regurgitation. IVC is dilated with respiratory  variation. Estimated RA pressure 8 mmHg. Aortic root 3.8 cm   Treadmill exercise stress test 05/06/2018: Indication: SVT The patient exercised on Bruce protocol for 12:57 min. Patient achieved 14.13 METS and reached HR 158 bpm, which is 104 % of maximum age-predicted HR. Stress test terminated due to fatigue. Resting EKG demonstrates Normal sinus rhythm. ST Changes: With peak exercise there was no ST-T changes of ischemia. Arrhythmias: none. BP Response to Exercise: Normal resting BP- exaggerated response. Resting 118/74 and max 120/102 in recovery at 1 mnute. ST Changes: With peak exercise there was no ST-T changes of ischemia. HR Response to Exercise: Appropriate. Exercise capacity was above average for age. Recommendations: Continue primary/secondary prevention.  Cardiac monitoring for 10 days at [facility] office in September 2019-sinus rhythm, average heart rate-61 per minute, range-44-155/m. There were rare PVCs and PACs. Total 13 runs of SVT, longest run lasted for 17 beats at average heart rate 100/m. Fastest run consisted of 4 beats with maximum heart rate-154/m.    Recent labs: 05/02/2020: Glucose 99, BUN/Cr 24/0.9. EGFR N/A. HbA1C N/A Chol 157, TG 72, HDL 58, LDL 86 TSH 1.4 normal     Review of Systems  Cardiovascular:  Positive for palpitations. Negative for chest pain, dyspnea on exertion, leg swelling and syncope.        Vitals:   01/06/21 1449  BP: (!) 143/86  Pulse: 67  Resp: 17  Temp: 97.7 F (36.5 C)  SpO2: 97%     Body mass  index is 23.88 kg/m. Filed Weights   01/06/21 1449  Weight: 186 lb (84.4 kg)     Objective:   Physical Exam Vitals and nursing note reviewed.  Constitutional:      General: He is not in acute distress. Cardiovascular:     Rate and Rhythm: Normal rate and regular rhythm.     Heart sounds: Normal heart sounds. No murmur heard. Pulmonary:     Effort: Pulmonary effort is normal.     Breath sounds: Normal breath sounds. No  wheezing or rales.  Musculoskeletal:     Right lower leg: No edema.     Left lower leg: No edema.          Assessment & Recommendations:   75 y.o. Caucasian male with h/o SVT, mixed hyperlipidemia  PSVT: Possible PSVT versus A. fib episode 1 week ago.  Given his infrequent symptoms, I still do not think he needs any medical management at this time.  Recommend vagal maneuvers.  Also enrolled him in cardio logs for monitoring of his heart rate.  I do not see any contraindication to proceeding with gallbladder surgery as scheduled.  F/u in 3 months  [place], MD [facility] Pager: [phone] Office: [phone]

## 2021-01-07 NOTE — Telephone Encounter (Signed)
From pt
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
## 2021-01-17 ENCOUNTER — Encounter (HOSPITAL_BASED_OUTPATIENT_CLINIC_OR_DEPARTMENT_OTHER)
Admission: RE | Disposition: A | Discharge status: Home / Self Care | Payer: Self-pay | Source: Home / Self Care | Attending: Surgery

## 2021-01-17 ENCOUNTER — Other Ambulatory Visit: Payer: Self-pay

## 2021-01-17 ENCOUNTER — Ambulatory Visit (HOSPITAL_BASED_OUTPATIENT_CLINIC_OR_DEPARTMENT_OTHER): Payer: Medicare Other | Admitting: Certified Registered"

## 2021-01-17 ENCOUNTER — Ambulatory Visit (HOSPITAL_BASED_OUTPATIENT_CLINIC_OR_DEPARTMENT_OTHER)
Admission: RE | Admit: 2021-01-17 | Discharge: 2021-01-17 | Disposition: A | Discharge status: Home / Self Care | Payer: Medicare Other | Attending: Surgery | Admitting: Surgery

## 2021-01-17 ENCOUNTER — Encounter (HOSPITAL_BASED_OUTPATIENT_CLINIC_OR_DEPARTMENT_OTHER): Payer: Self-pay | Admitting: Surgery

## 2021-01-17 DIAGNOSIS — E782 Mixed hyperlipidemia: Secondary | ICD-10-CM | POA: Diagnosis not present

## 2021-01-17 DIAGNOSIS — Z79899 Other long term (current) drug therapy: Secondary | ICD-10-CM | POA: Diagnosis not present

## 2021-01-17 DIAGNOSIS — K828 Other specified diseases of gallbladder: Secondary | ICD-10-CM | POA: Insufficient documentation

## 2021-01-17 DIAGNOSIS — Z8249 Family history of ischemic heart disease and other diseases of the circulatory system: Secondary | ICD-10-CM | POA: Diagnosis not present

## 2021-01-17 DIAGNOSIS — Z8261 Family history of arthritis: Secondary | ICD-10-CM | POA: Insufficient documentation

## 2021-01-17 DIAGNOSIS — Z800 Family history of malignant neoplasm of digestive organs: Secondary | ICD-10-CM | POA: Diagnosis not present

## 2021-01-17 DIAGNOSIS — I471 Supraventricular tachycardia: Secondary | ICD-10-CM | POA: Diagnosis not present

## 2021-01-17 DIAGNOSIS — K8010 Calculus of gallbladder with chronic cholecystitis without obstruction: Secondary | ICD-10-CM | POA: Insufficient documentation

## 2021-01-17 DIAGNOSIS — Z803 Family history of malignant neoplasm of breast: Secondary | ICD-10-CM | POA: Diagnosis not present

## 2021-01-17 DIAGNOSIS — K449 Diaphragmatic hernia without obstruction or gangrene: Secondary | ICD-10-CM | POA: Diagnosis not present

## 2021-01-17 DIAGNOSIS — K219 Gastro-esophageal reflux disease without esophagitis: Secondary | ICD-10-CM | POA: Insufficient documentation

## 2021-01-17 HISTORY — PX: CHOLECYSTECTOMY: SHX55

## 2021-01-17 SURGERY — Surgical Case
Anesthesia: *Unknown

## 2021-01-17 SURGERY — LAPAROSCOPIC CHOLECYSTECTOMY
Anesthesia: General | Site: Abdomen

## 2021-01-17 MED ORDER — ACETAMINOPHEN 500 MG PO TABS
ORAL_TABLET | ORAL | Status: AC
  Filled 2021-01-17: qty 2

## 2021-01-17 MED ORDER — SODIUM CHLORIDE 0.9 % IV SOLN
INTRAVENOUS | Status: AC | PRN
  Administered 2021-01-17: 1000 mL

## 2021-01-17 MED ORDER — GLYCOPYRROLATE 0.2 MG/ML IJ SOLN
INTRAMUSCULAR | Status: DC | PRN
  Administered 2021-01-17: .2 mg via INTRAVENOUS

## 2021-01-17 MED ORDER — PHENYLEPHRINE 40 MCG/ML (10ML) SYRINGE FOR IV PUSH (FOR BLOOD PRESSURE SUPPORT)
PREFILLED_SYRINGE | INTRAVENOUS | Status: AC
  Filled 2021-01-17: qty 10

## 2021-01-17 MED ORDER — FENTANYL CITRATE (PF) 100 MCG/2ML IJ SOLN
INTRAMUSCULAR | Status: DC | PRN
  Administered 2021-01-17 (×2): 25 ug via INTRAVENOUS
  Administered 2021-01-17: 100 ug via INTRAVENOUS

## 2021-01-17 MED ORDER — ACETAMINOPHEN 500 MG PO TABS
1000.0000 mg | ORAL_TABLET | ORAL | Status: AC
  Administered 2021-01-17: 1000 mg via ORAL

## 2021-01-17 MED ORDER — DEXAMETHASONE SODIUM PHOSPHATE 4 MG/ML IJ SOLN
INTRAMUSCULAR | Status: DC | PRN
  Administered 2021-01-17: 5 mg via INTRAVENOUS

## 2021-01-17 MED ORDER — BUPIVACAINE-EPINEPHRINE (PF) 0.5% -1:200000 IJ SOLN
INTRAMUSCULAR | Status: AC
  Filled 2021-01-17: qty 30

## 2021-01-17 MED ORDER — LIDOCAINE HCL (PF) 2 % IJ SOLN
INTRAMUSCULAR | Status: AC
  Filled 2021-01-17: qty 5

## 2021-01-17 MED ORDER — PROPOFOL 10 MG/ML IV BOLUS
INTRAVENOUS | Status: DC | PRN
  Administered 2021-01-17: 200 mg via INTRAVENOUS

## 2021-01-17 MED ORDER — FENTANYL CITRATE (PF) 100 MCG/2ML IJ SOLN
INTRAMUSCULAR | Status: AC
  Filled 2021-01-17: qty 2

## 2021-01-17 MED ORDER — EPHEDRINE SULFATE 50 MG/ML IJ SOLN
INTRAMUSCULAR | Status: DC | PRN
  Administered 2021-01-17 (×2): 10 mg via INTRAVENOUS

## 2021-01-17 MED ORDER — ONDANSETRON HCL 4 MG/2ML IJ SOLN
INTRAMUSCULAR | Status: AC
  Filled 2021-01-17: qty 2

## 2021-01-17 MED ORDER — MIDAZOLAM HCL 2 MG/2ML IJ SOLN
INTRAMUSCULAR | Status: AC
  Filled 2021-01-17: qty 2

## 2021-01-17 MED ORDER — LACTATED RINGERS IV SOLN: INTRAVENOUS | Status: DC

## 2021-01-17 MED ORDER — CHLORHEXIDINE GLUCONATE CLOTH 2 % EX PADS: 6.0000 | MEDICATED_PAD | Freq: Once | CUTANEOUS | Status: DC

## 2021-01-17 MED ORDER — MIDAZOLAM HCL 5 MG/5ML IJ SOLN
INTRAMUSCULAR | Status: DC | PRN
  Administered 2021-01-17: 2 mg via INTRAVENOUS

## 2021-01-17 MED ORDER — ONDANSETRON HCL 4 MG/2ML IJ SOLN
INTRAMUSCULAR | Status: DC | PRN
  Administered 2021-01-17: 4 mg via INTRAVENOUS

## 2021-01-17 MED ORDER — DEXAMETHASONE SODIUM PHOSPHATE 10 MG/ML IJ SOLN
INTRAMUSCULAR | Status: AC
  Filled 2021-01-17: qty 1

## 2021-01-17 MED ORDER — CEFAZOLIN SODIUM-DEXTROSE 2-4 GM/100ML-% IV SOLN
INTRAVENOUS | Status: AC
  Filled 2021-01-17: qty 100

## 2021-01-17 MED ORDER — LIDOCAINE HCL (CARDIAC) PF 100 MG/5ML IV SOSY
PREFILLED_SYRINGE | INTRAVENOUS | Status: DC | PRN
  Administered 2021-01-17: 50 mg via INTRAVENOUS

## 2021-01-17 MED ORDER — BUPIVACAINE-EPINEPHRINE (PF) 0.5% -1:200000 IJ SOLN
INTRAMUSCULAR | Status: DC | PRN
  Administered 2021-01-17: 12 mL
  Administered 2021-01-17: 10 mL

## 2021-01-17 MED ORDER — ROCURONIUM BROMIDE 100 MG/10ML IV SOLN
INTRAVENOUS | Status: DC | PRN
  Administered 2021-01-17: 50 mg via INTRAVENOUS

## 2021-01-17 MED ORDER — CEFAZOLIN SODIUM-DEXTROSE 2-4 GM/100ML-% IV SOLN
2.0000 g | INTRAVENOUS | Status: AC
  Administered 2021-01-17: 2 g via INTRAVENOUS

## 2021-01-17 MED ORDER — FENTANYL CITRATE (PF) 100 MCG/2ML IJ SOLN
25.0000 ug | INTRAMUSCULAR | Status: DC | PRN
  Administered 2021-01-17: 25 ug via INTRAVENOUS

## 2021-01-17 MED ORDER — TRAMADOL HCL 50 MG PO TABS: 50.0000 mg | ORAL_TABLET | Freq: Four times a day (QID) | ORAL | 0 refills | Status: DC | PRN

## 2021-01-17 MED ORDER — SUGAMMADEX SODIUM 200 MG/2ML IV SOLN
INTRAVENOUS | Status: DC | PRN
  Administered 2021-01-17: 200 mg via INTRAVENOUS

## 2021-01-17 MED ORDER — PROPOFOL 10 MG/ML IV BOLUS
INTRAVENOUS | Status: AC
  Filled 2021-01-17: qty 20

## 2021-01-17 SURGICAL SUPPLY — 43 items
ADH SKN CLS APL DERMABOND .7 (GAUZE/BANDAGES/DRESSINGS) ×1
APL PRP STRL LF DISP 70% ISPRP (MISCELLANEOUS) ×1
APPLIER CLIP 5 13 M/L LIGAMAX5 (MISCELLANEOUS) ×3
APR CLP MED LRG 5 ANG JAW (MISCELLANEOUS) ×1
BAG SPEC RTRVL LRG 6X4 10 (ENDOMECHANICALS) ×1
BLADE CLIPPER SURG (BLADE) ×2 IMPLANT
CHLORAPREP W/TINT 26 (MISCELLANEOUS) ×3 IMPLANT
CLIP APPLIE 5 13 M/L LIGAMAX5 (MISCELLANEOUS) ×1 IMPLANT
COVER MAYO STAND STRL (DRAPES) IMPLANT
DECANTER SPIKE VIAL GLASS SM (MISCELLANEOUS) ×3 IMPLANT
DERMABOND ADVANCED (GAUZE/BANDAGES/DRESSINGS) ×2
DERMABOND ADVANCED .7 DNX12 (GAUZE/BANDAGES/DRESSINGS) ×1 IMPLANT
DRAPE C-ARM 42X72 X-RAY (DRAPES) IMPLANT
ELECT REM PT RETURN 9FT ADLT (ELECTROSURGICAL) ×3
ELECTRODE REM PT RTRN 9FT ADLT (ELECTROSURGICAL) ×1 IMPLANT
GAUZE 4X4 16PLY ~~LOC~~+RFID DBL (SPONGE) ×2 IMPLANT
GLOVE SURG ENC MOIS LTX SZ7 (GLOVE) ×2 IMPLANT
GLOVE SURG POLYISO LF SZ7 (GLOVE) ×2 IMPLANT
GLOVE SURG POLYISO LF SZ8 (GLOVE) ×2 IMPLANT
GLOVE SURG SIGNA 7.5 PF LTX (GLOVE) ×3 IMPLANT
GLOVE SURG UNDER POLY LF SZ7 (GLOVE) ×6 IMPLANT
GOWN STRL REUS W/ TWL LRG LVL3 (GOWN DISPOSABLE) ×2 IMPLANT
GOWN STRL REUS W/ TWL XL LVL3 (GOWN DISPOSABLE) ×1 IMPLANT
GOWN STRL REUS W/TWL 2XL LVL3 (GOWN DISPOSABLE) ×2 IMPLANT
GOWN STRL REUS W/TWL LRG LVL3 (GOWN DISPOSABLE) ×9
GOWN STRL REUS W/TWL XL LVL3 (GOWN DISPOSABLE) ×3
NS IRRIG 1000ML POUR BTL (IV SOLUTION) ×3 IMPLANT
PACK BASIN DAY SURGERY FS (CUSTOM PROCEDURE TRAY) ×3 IMPLANT
POUCH SPECIMEN RETRIEVAL 10MM (ENDOMECHANICALS) ×3 IMPLANT
SCISSORS LAP 5X35 DISP (ENDOMECHANICALS) ×3 IMPLANT
SET CHOLANGIOGRAPH 5 50 .035 (SET/KITS/TRAYS/PACK) IMPLANT
SET IRRIG TUBING LAPAROSCOPIC (IRRIGATION / IRRIGATOR) ×3 IMPLANT
SET TUBE SMOKE EVAC HIGH FLOW (TUBING) ×3 IMPLANT
SLEEVE ENDOPATH XCEL 5M (ENDOMECHANICALS) ×6 IMPLANT
SLEEVE SCD COMPRESS KNEE MED (STOCKING) ×3 IMPLANT
SPONGE T-LAP 18X18 ~~LOC~~+RFID (SPONGE) ×2 IMPLANT
SUT MON AB 4-0 PC3 18 (SUTURE) ×3 IMPLANT
TOWEL GREEN STERILE FF (TOWEL DISPOSABLE) ×3 IMPLANT
TRAY LAPAROSCOPIC (CUSTOM PROCEDURE TRAY) ×3 IMPLANT
TROCAR XCEL BLUNT TIP 100MML (ENDOMECHANICALS) ×3 IMPLANT
TROCAR XCEL NON-BLD 5MMX100MML (ENDOMECHANICALS) ×3 IMPLANT
TUBE CONNECTING 20'X1/4 (TUBING) ×1
TUBE CONNECTING 20X1/4 (TUBING) ×2 IMPLANT

## 2021-01-17 NOTE — Op Note (Signed)
Laparoscopic Cholecystectomy Procedure Note  Indications: This patient presents with symptomatic gallbladder disease and will undergo laparoscopic cholecystectomy.  Pre-operative Diagnosis: symptomatic cholelthiasis  Post-operative Diagnosis: Same  Surgeon: [name] MD  Assistants: Freeman Caldron MD ([facility] Resident)  Anesthesia: General endotracheal anesthesia  ASA Class: 2  Procedure Details  The patient was seen again in the Holding Room. The risks, benefits, complications, treatment options, and expected outcomes were discussed with the patient. The possibilities of reaction to medication, pulmonary aspiration, perforation of viscus, bleeding, recurrent infection, finding a normal gallbladder, the need for additional procedures, failure to diagnose a condition, the possible need to convert to an open procedure, and creating a complication requiring transfusion or operation were discussed with the patient. The likelihood of improving the patient's symptoms with return to their baseline status is good.  The patient and/or family concurred with the proposed plan, giving informed consent. The site of surgery properly noted. The patient was taken to Operating Room, identified as Austin Graves and the procedure verified as Laparoscopic Cholecystectomy with Intraoperative Cholangiogram. A Time Out was held and the above information confirmed.  Prior to the induction of general anesthesia, antibiotic prophylaxis was administered. General endotracheal anesthesia was then administered and tolerated well. After the induction, the abdomen was prepped with Chloraprep and draped in sterile fashion. The patient was positioned in the supine position.  Local anesthetic agent was injected into the skin near the umbilicus and an incision made. We dissected down to the abdominal fascia with blunt dissection.  The fascia was incised vertically and we entered the peritoneal cavity bluntly.  A pursestring  suture of 0-Vicryl was placed around the fascial opening.  The Hasson cannula was inserted and secured with the stay suture.  Pneumoperitoneum was then created with CO2 and tolerated well without any adverse changes in the patient's vital signs. A 5-mm port was placed in the subxiphoid position.  Two 5-mm ports were placed in the right upper quadrant. All skin incisions were infiltrated with a local anesthetic agent before making the incision and placing the trocars.   We positioned the patient in reverse Trendelenburg, tilted slightly to the patient's left.  Omentum was bluntly dissected from the abdominal wall around the umbilicus. The gallbladder was identified, the fundus grasped and retracted cephalad. Adhesions were lysed bluntly and with the electrocautery where indicated, taking care not to injure any adjacent organs or viscus. The infundibulum was grasped and retracted laterally, exposing the peritoneum overlying the triangle of Calot. This was then divided and exposed in a blunt fashion. The cystic duct was clearly identified and bluntly dissected circumferentially. A critical view of the cystic duct and cystic artery was obtained.  The cystic duct was then ligated with 3 clips proximally and 1 distally and divided. The cystic artery was, dissected free, ligated with clips and divided as well.   The gallbladder was dissected from the liver bed in retrograde fashion with the electrocautery. The gallbladder was removed and placed in an Endocatch sac. The liver bed was irrigated and inspected. Hemostasis was achieved with the electrocautery. Copious irrigation was utilized and was repeatedly aspirated until clear.  The gallbladder and Endocatch sac were then removed through the umbilical port site.  The pursestring suture was used to close the umbilical fascia.    We again inspected the right upper quadrant for hemostasis.  Pneumoperitoneum was released as we removed the trocars.  4-0 Monocryl was used  to close the skin.   Skin glue was then applied.  The patient was then extubated and brought to the recovery room in stable condition. Instrument, sponge, and needle counts were correct at closure and at the conclusion of the case.   Findings: Chronic Cholecystitis with Cholelithiasis  Estimated Blood Loss: Minimal         Drains: 0         Specimens: Gallbladder           Complications: None; patient tolerated the procedure well.         Disposition: PACU - hemodynamically stable.         Condition: stable

## 2021-01-17 NOTE — Anesthesia Preprocedure Evaluation (Signed)
Anesthesia Evaluation  Patient identified by MRN, date of birth, ID band Patient awake    Reviewed: Allergy & Precautions, NPO status , Patient's Chart, lab work & pertinent test results  Airway Mallampati: II  TM Distance: >3 FB     Dental   Pulmonary    breath sounds clear to auscultation       Cardiovascular negative cardio ROS   Rhythm:Regular Rate:Normal     Neuro/Psych  Neuromuscular disease    GI/Hepatic Neg liver ROS, GERD  ,  Endo/Other  negative endocrine ROS  Renal/GU negative Renal ROS     Musculoskeletal   Abdominal   Peds  Hematology   Anesthesia Other Findings   Reproductive/Obstetrics                             Anesthesia Physical Anesthesia Plan  ASA: 3  Anesthesia Plan: General   Post-op Pain Management:    Induction: Intravenous  PONV Risk Score and Plan: 3 and Ondansetron, Dexamethasone and Midazolam  Airway Management Planned: Oral ETT  Additional Equipment:   Intra-op Plan:   Post-operative Plan: Extubation in OR  Informed Consent: I have reviewed the patients History and Physical, chart, labs and discussed the procedure including the risks, benefits and alternatives for the proposed anesthesia with the patient or authorized representative who has indicated his/her understanding and acceptance.       Plan Discussed with: CRNA and Anesthesiologist  Anesthesia Plan Comments:         Anesthesia Quick Evaluation

## 2021-01-17 NOTE — Discharge Instructions (Addendum)
[facility] ______[facility] LAPAROSCOPIC SURGERY: POST OP INSTRUCTIONS Always review your discharge instruction sheet given to you by the facility where your surgery was performed. IF YOU HAVE DISABILITY OR FAMILY LEAVE FORMS, YOU MUST BRING THEM TO THE OFFICE FOR PROCESSING.   DO NOT GIVE THEM TO YOUR DOCTOR.  A prescription for pain medication may be given to you upon discharge.  Take your pain medication as prescribed, if needed.  If narcotic pain medicine is not needed, then you may take acetaminophen (Tylenol) or ibuprofen (Advil) as needed. Take your usually prescribed medications unless otherwise directed. If you need a refill on your pain medication, please contact your pharmacy.  They will contact our office to request authorization. Prescriptions will not be filled after 5pm or on week-ends. You should follow a light diet the first few days after arrival home, such as soup and crackers, etc.  Be sure to include lots of fluids daily. Most patients will experience some swelling and bruising in the area of the incisions.  Ice packs will help.  Swelling and bruising can take several days to resolve.  It is common to experience some constipation if taking pain medication after surgery.  Increasing fluid intake and taking a stool softener (such as Colace) will usually help or prevent this problem from occurring.  A mild laxative (Milk of Magnesia or Miralax) should be taken according to package instructions if there are no bowel movements after 48 hours. Unless discharge instructions indicate otherwise, you may remove your bandages 24-48 hours after surgery, and you may shower at that time.  You may have steri-strips (small skin tapes) in place directly over the incision.  These strips should be left on the skin for 7-10 days.  If your surgeon used skin glue on the incision, you may shower in 24 hours.  The glue will flake off over the next 2-3 weeks.  Any sutures or staples will be  removed at the office during your follow-up visit. ACTIVITIES:  You may resume regular (light) daily activities beginning the next day--such as daily self-care, walking, climbing stairs--gradually increasing activities as tolerated.  You may have sexual intercourse when it is comfortable.  Refrain from any heavy lifting or straining until approved by your doctor. You may drive when you are no longer taking prescription pain medication, you can comfortably wear a seatbelt, and you can safely maneuver your car and apply brakes. RETURN TO WORK:  __________________________________________________________ Bonita Quin should see your doctor in the office for a follow-up appointment approximately 2-3 weeks after your surgery.  Make sure that you call for this appointment within a day or two after you arrive home to insure a convenient appointment time. OTHER INSTRUCTIONS: OK TO SHOWER STARTING TOMORRROW ICE PACK, TYLENOL, AND IBUPROFEN ALSO FOR PAIN NO LIFTING MORE THAN 15 TO 20 POUNDS FOR 2 WEEKS __________________________________________________________________________________________________________________________ __________________________________________________________________________________________________________________________ WHEN TO CALL YOUR DOCTOR: Fever over 101.0 Inability to urinate Continued bleeding from incision. Increased pain, redness, or drainage from the incision. Increasing abdominal pain  The clinic staff is available to answer your questions during regular business hours.  Please don't hesitate to call and ask to speak to one of the nurses for clinical concerns.  If you have a medical emergency, go to the nearest emergency room or call 911.  A surgeon from [facility] is always on call at the hospital. [address], [address], [place], [place]  [address] ? [address] [address], [place], [place]   [address] [phone] ?  [phone] ? FAX [phone] Web site:  [email or web address]   You may have Tylenol after 1:45pm today, if needed.   Post Anesthesia Home Care Instructions  Activity: Get plenty of rest for the remainder of the day. A responsible individual must stay with you for 24 hours following the procedure.  For the next 24 hours, DO NOT: -Drive a car -Advertising copywriter -Drink alcoholic beverages -Take any medication unless instructed by your physician -Make any legal decisions or sign important papers.  Meals: Start with liquid foods such as gelatin or soup. Progress to regular foods as tolerated. Avoid greasy, spicy, heavy foods. If nausea and/or vomiting occur, drink only clear liquids until the nausea and/or vomiting subsides. Call your physician if vomiting continues.  Special Instructions/Symptoms: Your throat may feel dry or sore from the anesthesia or the breathing tube placed in your throat during surgery. If this causes discomfort, gargle with warm salt water. The discomfort should disappear within 24 hours.  If you had a scopolamine patch placed behind your ear for the management of post- operative nausea and/or vomiting:  1. The medication in the patch is effective for 72 hours, after which it should be removed.  Wrap patch in a tissue and discard in the trash. Wash hands thoroughly with soap and water. 2. You may remove the patch earlier than 72 hours if you experience unpleasant side effects which may include dry mouth, dizziness or visual disturbances. 3. Avoid touching the patch. Wash your hands with soap and water after contact with the patch.

## 2021-01-17 NOTE — Anesthesia Postprocedure Evaluation (Signed)
Anesthesia Post Note  Patient: Austin Graves  Procedure(s) Performed: LAPAROSCOPIC CHOLECYSTECTOMY (Abdomen)     Patient location during evaluation: PACU Anesthesia Type: General Level of consciousness: awake Pain management: pain level controlled Vital Signs Assessment: post-procedure vital signs reviewed and stable Respiratory status: spontaneous breathing Cardiovascular status: stable Postop Assessment: no apparent nausea or vomiting Anesthetic complications: no   No notable events documented.  Last Vitals:  Vitals:   01/17/21 1047 01/17/21 1101  BP: (!) 158/90 (!) 154/89  Pulse: 63 (!) 53  Resp: 17 20  Temp:  (!) 35.7 C  SpO2: 96% 99%    Last Pain:  Vitals:   01/17/21 1047  TempSrc:   PainSc: Asleep                 [name]

## 2021-01-17 NOTE — Transfer of Care (Signed)
Immediate Anesthesia Transfer of Care Note  Patient: Austin Graves  Procedure(s) Performed: LAPAROSCOPIC CHOLECYSTECTOMY (Abdomen)  Patient Location: PACU  Anesthesia Type:General  Level of Consciousness: awake, alert  and oriented  Airway & Oxygen Therapy: Patient Spontanous Breathing and Patient connected to face mask oxygen  Post-op Assessment: Report given to RN and Post -op Vital signs reviewed and stable  Post vital signs: Reviewed and stable  Last Vitals:  Vitals Value Taken Time  BP    Temp    Pulse 62 01/17/21 1045  Resp    SpO2 96 % 01/17/21 1045  Vitals shown include unvalidated device data.  Last Pain:  Vitals:   01/17/21 0737  TempSrc: Oral  PainSc: 0-No pain         Complications: No notable events documented.

## 2021-01-17 NOTE — Interval H&P Note (Signed)
History and Physical Interval Note: no change in H and P  01/17/2021 8:54 AM  Austin Graves  has presented today for surgery, with the diagnosis of SYMPTOMATIC GALLSTONES.  The various methods of treatment have been discussed with the patient and family. After consideration of risks, benefits and other options for treatment, the patient has consented to  Procedure(s): LAPAROSCOPIC CHOLECYSTECTOMY (N/A) as a surgical intervention.  The patient's history has been reviewed, patient examined, no change in status, stable for surgery.  I have reviewed the patient's chart and labs.  Questions were answered to the patient's satisfaction.     [name]

## 2021-01-21 ENCOUNTER — Encounter (HOSPITAL_BASED_OUTPATIENT_CLINIC_OR_DEPARTMENT_OTHER): Payer: Self-pay | Admitting: Surgery

## 2021-01-21 LAB — SURGICAL PATHOLOGY

## 2021-01-21 NOTE — Addendum Note (Signed)
Addendum  created 01/21/21 0720 by Karen Kitchens, CRNA   Charge Capture section accepted

## 2021-01-21 NOTE — Addendum Note (Signed)
Addendum  created 01/21/21 0715 by Karen Kitchens, CRNA   Attestation recorded in [place], Intraprocedure Attestations deleted, Intraprocedure Attestations filed

## 2021-03-26 ENCOUNTER — Other Ambulatory Visit: Payer: Self-pay

## 2021-03-26 ENCOUNTER — Ambulatory Visit: Payer: Medicare Other | Admitting: Cardiology

## 2021-03-26 ENCOUNTER — Encounter: Payer: Self-pay | Admitting: Cardiology

## 2021-03-26 VITALS — BP 141/84 | HR 67 | Temp 97.8°F | Resp 16 | Ht 74.0 in | Wt 183.0 lb

## 2021-03-26 DIAGNOSIS — I480 Paroxysmal atrial fibrillation: Secondary | ICD-10-CM | POA: Insufficient documentation

## 2021-03-26 DIAGNOSIS — R030 Elevated blood-pressure reading, without diagnosis of hypertension: Secondary | ICD-10-CM | POA: Diagnosis not present

## 2021-03-26 NOTE — Progress Notes (Signed)
Follow up visit  Subjective:   Austin Graves, male    DOB: 27-Oct-1945, 75 y.o.   MRN: [id number]    HPI  75 y.o. Caucasian male with h/o SVT, PAF  Similar to previous episode in June 2022, patient had another episode of palpitations and irregular heartbeat that started around 3 AM and continued for couple of hours.  However, the improvement throughout the day, and completely resolved after physical activity.  Patient did activate his apple watch, which showed atrial fibrillation as reviewed by me on cardio logs monitoring.  Patient has had no episodes since then.  At baseline, he remains very active, regularly rides mountain bikes several miles.  Home blood pressure log shows normal blood pressures, only to be elevated during doctor visits.  Current Outpatient Medications on File Prior to Visit  Medication Sig Dispense Refill   Calcium Carbonate (CALCIUM 600 PO) Take by mouth daily.     Cholecalciferol (VITAMIN D3 PO) Take by mouth daily.     ibuprofen (ADVIL,MOTRIN) 400 MG tablet Take 400 mg by mouth every 6 (six) hours as needed for headache.     LYSINE PO Take 1 Dose by mouth daily.     omeprazole (PRILOSEC) 20 MG capsule Take 20 mg by mouth every Monday, Wednesday, and Friday.     No current facility-administered medications on file prior to visit.    Cardiovascular & other pertient studies: Media Information   Media Information   EKG 01/06/2021: Sinus rhythm 61 bpm  RSR(V1) -nondiagnostic Normal QTc interval  Echocardiogram 06/21/2019: Left ventricle cavity is normal in size. Mild concentric hypertrophy of the left ventricle. Normal LV systolic function with EF 57%. Normal global wall motion. Normal diastolic filling pattern. 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  Trileaflet aortic valve. Mild (Grade I) aortic regurgitation. Mild (Grade I) mitral regurgitation. 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 Estimated RA  pressure 8 mmHg. Aortic root 3.8 cm   Treadmill exercise stress test 05/06/2018: Indication: SVT The patient exercised on Bruce protocol for 12:57 min. Patient achieved 14.13 METS and reached HR 158 bpm, which is 104 % of maximum age-predicted HR. Stress test terminated due to fatigue. Resting EKG demonstrates Normal sinus rhythm. ST Changes: With peak exercise there was no ST-T changes of ischemia. Arrhythmias: none. BP Response to Exercise: Normal resting BP- exaggerated response. Resting 118/74 and max 120/102 in recovery at 1 mnute. ST Changes: With peak exercise there was no ST-T changes of ischemia. HR Response to Exercise: Appropriate. Exercise capacity was above average for age. Recommendations: Continue primary/secondary prevention.  Cardiac monitoring for 10 days at [facility] office in September 2019-sinus rhythm, average heart rate-61 per minute, range-44-155/m. There were rare PVCs and PACs. Total 13 runs of SVT, longest run lasted for 17 beats at average heart rate 100/m. Fastest run consisted of 4 beats with maximum heart rate-154/m.    Recent labs: 05/02/2020: Glucose 99, BUN/Cr 24/0.9. EGFR N/A. HbA1C N/A Chol 157, TG 72, HDL 58, LDL 86 TSH 1.4 normal     Review of Systems  Cardiovascular:  Positive for palpitations. Negative for chest pain, dyspnea on exertion, leg swelling and syncope.        Vitals:   03/26/21 0942  BP: (!) 141/84  Pulse: 67  Resp: 16  Temp: 97.8 F (36.6 C)  SpO2: 97%     Body mass index is 23.5 kg/m. Filed Weights   03/26/21 0942  Weight: 183 lb (83 kg)  Objective:   Physical Exam Vitals and nursing note reviewed.  Constitutional:      General: He is not in acute distress. Cardiovascular:     Rate and Rhythm: Normal rate and regular rhythm.     Heart sounds: Normal heart sounds. No murmur heard. Pulmonary:     Effort: Pulmonary effort is normal.     Breath sounds: Normal breath sounds. No wheezing or rales.   Musculoskeletal:     Right lower leg: No edema.     Left lower leg: No edema.          Assessment & Recommendations:    75 y.o. Caucasian male with h/o PSVT, PAF  PAF: Prior history of PSVT, with no recent recurrence.  However, noted to have atrial fibrillation on 03/21/2021.   Episodes remain infrequent and do not significantly affect his day-to-day activity or his physical activity. Given minimal symptoms, patient would like to avoid any maintenance rhythm control therapy with antiarrhythmics or ablation. Nonetheless, he is interested in "pill in pocket" approach with propafenone.  I will obtain exercise nuclear stress test and echocardiogram to evaluate for any ischemic heart disease.  If excluded, could place on propafenone. No true hypertension given significantly low blood pressures on home monitoring.  As such, CHA2DS2-VASc score is low at 1 with <1% stroke risk.  Given patient's significant other activity including mountain biking, he would like to avoid anticoagulation at this time.  I think this is perfectly reasonable.  F/u in 3 months  [place], MD [facility] Pager: [phone] Office: [phone]

## 2021-03-27 DIAGNOSIS — Z23 Encounter for immunization: Secondary | ICD-10-CM | POA: Diagnosis not present

## 2021-04-08 ENCOUNTER — Ambulatory Visit: Payer: Medicare Other

## 2021-04-08 ENCOUNTER — Other Ambulatory Visit: Payer: Self-pay

## 2021-04-08 DIAGNOSIS — I480 Paroxysmal atrial fibrillation: Secondary | ICD-10-CM

## 2021-04-10 ENCOUNTER — Ambulatory Visit: Payer: Medicare Other | Admitting: Cardiology

## 2021-04-16 ENCOUNTER — Telehealth: Payer: Self-pay | Admitting: Cardiology

## 2021-04-16 DIAGNOSIS — I480 Paroxysmal atrial fibrillation: Secondary | ICD-10-CM

## 2021-04-16 MED ORDER — FLECAINIDE ACETATE 100 MG PO TABS: 100.0000 mg | ORAL_TABLET | ORAL | 3 refills | Status: DC

## 2021-04-16 NOTE — Telephone Encounter (Signed)
Structurally normal heart, no ischemia on stress testing.  Recommend flecainide as pill in pocket approach. 200 mg (2 tabs of 100 mg) for occasional Afib episodes. Patient knows to inform me, should he have any episode. Avoid strenuous physical activity in the event of such episode.   Elder Negus, MD Pager: [phone] Office: [phone]

## 2021-05-08 DIAGNOSIS — Z13220 Encounter for screening for lipoid disorders: Secondary | ICD-10-CM | POA: Diagnosis not present

## 2021-05-08 DIAGNOSIS — Z125 Encounter for screening for malignant neoplasm of prostate: Secondary | ICD-10-CM | POA: Diagnosis not present

## 2021-05-08 DIAGNOSIS — R351 Nocturia: Secondary | ICD-10-CM | POA: Diagnosis not present

## 2021-05-08 DIAGNOSIS — I480 Paroxysmal atrial fibrillation: Secondary | ICD-10-CM | POA: Diagnosis not present

## 2021-05-08 DIAGNOSIS — E559 Vitamin D deficiency, unspecified: Secondary | ICD-10-CM | POA: Diagnosis not present

## 2021-05-08 DIAGNOSIS — H9193 Unspecified hearing loss, bilateral: Secondary | ICD-10-CM | POA: Diagnosis not present

## 2021-05-08 DIAGNOSIS — R7303 Prediabetes: Secondary | ICD-10-CM | POA: Diagnosis not present

## 2021-05-08 DIAGNOSIS — Z136 Encounter for screening for cardiovascular disorders: Secondary | ICD-10-CM | POA: Diagnosis not present

## 2021-05-08 DIAGNOSIS — Z0000 Encounter for general adult medical examination without abnormal findings: Secondary | ICD-10-CM | POA: Diagnosis not present

## 2021-05-08 DIAGNOSIS — L820 Inflamed seborrheic keratosis: Secondary | ICD-10-CM | POA: Diagnosis not present

## 2021-05-08 DIAGNOSIS — Z79899 Other long term (current) drug therapy: Secondary | ICD-10-CM | POA: Diagnosis not present

## 2021-05-08 DIAGNOSIS — Z1389 Encounter for screening for other disorder: Secondary | ICD-10-CM | POA: Diagnosis not present

## 2021-06-25 ENCOUNTER — Ambulatory Visit: Payer: Medicare Other | Admitting: Cardiology

## 2021-07-04 NOTE — Progress Notes (Signed)
Follow up visit  Subjective:   Austin Graves, male    DOB: Dec 21, 1945, 75 y.o.   MRN: [id number]    HPI  75 y.o. Caucasian male with h/o SVT, PAF  Patient has been on as needed flecainide for paroxysmal A. fib since September 2022.  He had 2 episodes of A. fib, noticed by him as palpitations, in November.  Both episodes were at rest, just before going to bed.  I reviewed his apple watch log, see media for details.  Both were episodes of A. fib with heart rate in 70s.  He took flecainide and went to sleep with no other significant symptoms.  He wonders if Flomax could have triggered all of these episodes, as he had taken Flomax in the morning of one of the episodes.  Tel encounter 04/16/2021: Structurally normal heart, no ischemia on stress testing.  Recommend flecainide as pill in pocket approach. 200 mg (2 tabs of 100 mg) for occasional Afib episodes. Patient knows to inform me, should he have any episode. Avoid strenuous physical activity in the event of such episode.   Current Outpatient Medications on File Prior to Visit  Medication Sig Dispense Refill   Calcium Carbonate (CALCIUM 600 PO) Take by mouth daily.     Cholecalciferol (VITAMIN D3 PO) Take by mouth daily.     flecainide (TAMBOCOR) 100 MG tablet Take 1 tablet (100 mg total) by mouth as directed. Take 2 pills once as "pill in pocket" approach for occasional Afib episodes 30 tablet 3   ibuprofen (ADVIL,MOTRIN) 400 MG tablet Take 400 mg by mouth every 6 (six) hours as needed for headache.     LYSINE PO Take 1 Dose by mouth daily.     omeprazole (PRILOSEC) 20 MG capsule Take 20 mg by mouth every Monday, Wednesday, and Friday.     No current facility-administered medications on file prior to visit.    Cardiovascular & other pertient studies:  Lexiscan Tetrofosmin stress test 04/08/2021: 1 Day Rest/Stress Protocol. Stress EKG is non-diagnostic for ischemia as it's a pharmacologic stress test using Lexiscan. Normal  myocardial perfusion without convincing evidence of reversible myocardial ischemia or prior infarct. LVEF per gated SPECT 52%, visually appears preserved. Left ventricular size mildly dilated (EDV 129 cc), no regional wall motion abnormalities, and wall thickness preserved. Low risk study. No prior studies available for comparison.   Echocardiogram 04/08/2021:  Normal LV systolic function with visual EF 55-60%. Left ventricle cavity  is normal in size. Mild left ventricular hypertrophy. Normal global wall  motion. Normal diastolic filling pattern, normal LAP.  Trace tricuspid regurgitation. No evidence of pulmonary hypertension.  Mild pulmonic regurgitation.  Compared to study 06/21/2019 no significant change.   Media Information   Media Information   EKG 01/06/2021: Sinus rhythm 61 bpm  RSR(V1) -nondiagnostic Normal QTc interval  Echocardiogram 06/21/2019: Left ventricle cavity is normal in size. Mild concentric hypertrophy of the left ventricle. Normal LV systolic function with EF 57%. Normal global wall motion. Normal diastolic filling pattern. 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  Trileaflet aortic valve. Mild (Grade I) aortic regurgitation. Mild (Grade I) mitral regurgitation. 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 Estimated RA pressure 8 mmHg. Aortic root 3.8 cm   Treadmill exercise stress test 05/06/2018: Indication: SVT The patient exercised on Bruce protocol for 12:57 min. Patient achieved 14.13 METS and reached HR 158 bpm, which is 104 % of maximum age-predicted HR. Stress test terminated due to fatigue. Resting  EKG demonstrates Normal sinus rhythm. ST Changes: With peak exercise there was no ST-T changes of ischemia. Arrhythmias: none. BP Response to Exercise: Normal resting BP- exaggerated response. Resting 118/74 and max 120/102 in recovery at 1 mnute. ST Changes: With peak exercise there was no ST-T changes of  ischemia. HR Response to Exercise: Appropriate. Exercise capacity was above average for age. Recommendations: Continue primary/secondary prevention.  Cardiac monitoring for 10 days at [facility] office in September 2019-sinus rhythm, average heart rate-61 per minute, range-44-155/m. There were rare PVCs and PACs. Total 13 runs of SVT, longest run lasted for 17 beats at average heart rate 100/m. Fastest run consisted of 4 beats with maximum heart rate-154/m.    Recent labs: 05/02/2020: Glucose 99, BUN/Cr 24/0.9. EGFR N/A. HbA1C N/A Chol 157, TG 72, HDL 58, LDL 86 TSH 1.4 normal     Review of Systems  Cardiovascular:  Positive for palpitations. Negative for chest pain, dyspnea on exertion, leg swelling and syncope.        Vitals:   07/07/21 0936  BP: (!) 142/84  Pulse: (!) 58  Resp: 16  Temp: (!) 97.5 F (36.4 C)  SpO2: 98%     Body mass index is 23.24 kg/m. Filed Weights   07/07/21 0936  Weight: 181 lb (82.1 kg)     Objective:   Physical Exam Vitals and nursing note reviewed.  Constitutional:      General: He is not in acute distress. Cardiovascular:     Rate and Rhythm: Normal rate and regular rhythm.     Heart sounds: Normal heart sounds. No murmur heard. Pulmonary:     Effort: Pulmonary effort is normal.     Breath sounds: Normal breath sounds. No wheezing or rales.  Musculoskeletal:     Right lower leg: No edema.     Left lower leg: No edema.          Assessment & Recommendations:    75 y.o. Caucasian male with h/o PSVT, PAF  PAF: Structurally normal heart, no ischemia on stress testing.  Recommend flecainide as pill in pocket approach. 200 mg (2 tabs of 100 mg) for occasional Afib episodes. Patient knows to inform me, should he have any episode. Avoid strenuous physical activity in the event of such episode.  I will see him back in 3 months.  If his frequency and severity of symptoms increases, he is interested in considering ablation.  On a  separate note, I do not think Flomax triggered his A. fib, and his A. fib episode on the day was purely considered low.  As always, blood pressure is elevated in the office, normal at home.  No change made today.  F/u in 3 months  [place], MD [facility] Pager: [phone] Office: [phone]

## 2021-07-07 ENCOUNTER — Ambulatory Visit: Payer: Medicare Other | Admitting: Cardiology

## 2021-07-07 ENCOUNTER — Other Ambulatory Visit: Payer: Self-pay

## 2021-07-07 ENCOUNTER — Encounter: Payer: Self-pay | Admitting: Cardiology

## 2021-07-07 VITALS — BP 142/84 | HR 58 | Temp 97.5°F | Resp 16 | Ht 74.0 in | Wt 181.0 lb

## 2021-07-07 DIAGNOSIS — R030 Elevated blood-pressure reading, without diagnosis of hypertension: Secondary | ICD-10-CM | POA: Diagnosis not present

## 2021-07-07 DIAGNOSIS — I480 Paroxysmal atrial fibrillation: Secondary | ICD-10-CM

## 2021-07-07 DIAGNOSIS — I471 Supraventricular tachycardia: Secondary | ICD-10-CM

## 2021-07-07 NOTE — Progress Notes (Signed)
Episodes of Afib as notes on Apple watch, monitored on cardiologs

## 2021-08-15 ENCOUNTER — Ambulatory Visit: Payer: Medicare Other | Admitting: Cardiology

## 2021-08-15 ENCOUNTER — Encounter: Payer: Self-pay | Admitting: Cardiology

## 2021-08-15 ENCOUNTER — Other Ambulatory Visit: Payer: Self-pay

## 2021-08-15 VITALS — BP 138/91 | HR 57 | Temp 98.0°F | Resp 16 | Ht 74.0 in | Wt 177.0 lb

## 2021-08-15 DIAGNOSIS — R072 Precordial pain: Secondary | ICD-10-CM

## 2021-08-15 DIAGNOSIS — I480 Paroxysmal atrial fibrillation: Secondary | ICD-10-CM

## 2021-08-15 NOTE — Progress Notes (Signed)
Follow up visit  Subjective:   Austin Graves, male    DOB: 1946/03/07, 76 y.o.   MRN: [id number]    HPI  76 y.o. Caucasian male with h/o SVT, PAF  Patient woke up 3 AM this morning with focal lower retrosternal chest pain. Pain does not radiate anywhere, does not have any associated symptoms of shortness of breath, presyncope or syncope. Pain is worse with certain movements, such as stretching his arms. No specific correlation with exertion.  Blood pressure slightly elevated today, similar to prior visits. BP is normal at home. He has not had any recurrent palpitations in last several weeks  Current Outpatient Medications on File Prior to Visit  Medication Sig Dispense Refill   Calcium Carbonate (CALCIUM 600 PO) Take by mouth daily.     Cholecalciferol (VITAMIN D3 PO) Take by mouth daily.     flecainide (TAMBOCOR) 100 MG tablet Take 1 tablet (100 mg total) by mouth as directed. Take 2 pills once as "pill in pocket" approach for occasional Afib episodes 30 tablet 3   ibuprofen (ADVIL,MOTRIN) 400 MG tablet Take 400 mg by mouth every 6 (six) hours as needed for headache.     LYSINE PO Take 1 Dose by mouth daily.     omeprazole (PRILOSEC) 20 MG capsule Take 20 mg by mouth every Monday, Wednesday, and Friday.     No current facility-administered medications on file prior to visit.    Cardiovascular & other pertient studies:  EKG 08/15/2021: Sinus rhythm 53 bpm  Lexiscan Tetrofosmin stress test 04/08/2021: 1 Day Rest/Stress Protocol. Stress EKG is non-diagnostic for ischemia as it's a pharmacologic stress test using Lexiscan. Normal myocardial perfusion without convincing evidence of reversible myocardial ischemia or prior infarct. LVEF per gated SPECT 52%, visually appears preserved. Left ventricular size mildly dilated (EDV 129 cc), no regional wall motion abnormalities, and wall thickness preserved. Low risk study. No prior studies available for comparison.   Echocardiogram  04/08/2021:  Normal LV systolic function with visual EF 55-60%. Left ventricle cavity  is normal in size. Mild left ventricular hypertrophy. Normal global wall  motion. Normal diastolic filling pattern, normal LAP.  Trace tricuspid regurgitation. No evidence of pulmonary hypertension.  Mild pulmonic regurgitation.  Compared to study 06/21/2019 no significant change.   Media Information   Media Information   EKG 01/06/2021: Sinus rhythm 61 bpm  RSR(V1) -nondiagnostic Normal QTc interval  Echocardiogram 06/21/2019: Left ventricle cavity is normal in size. Mild concentric hypertrophy of the left ventricle. Normal LV systolic function with EF 57%. Normal global wall motion. Normal diastolic filling pattern. 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  Trileaflet aortic valve. Mild (Grade I) aortic regurgitation. Mild (Grade I) mitral regurgitation. 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 Estimated RA pressure 8 mmHg. Aortic root 3.8 cm   Treadmill exercise stress test 05/06/2018: Indication: SVT The patient exercised on Bruce protocol for 12:57 min. Patient achieved 14.13 METS and reached HR 158 bpm, which is 104 % of maximum age-predicted HR. Stress test terminated due to fatigue. Resting EKG demonstrates Normal sinus rhythm. ST Changes: With peak exercise there was no ST-T changes of ischemia. Arrhythmias: none. BP Response to Exercise: Normal resting BP- exaggerated response. Resting 118/74 and max 120/102 in recovery at 1 mnute. ST Changes: With peak exercise there was no ST-T changes of ischemia. HR Response to Exercise: Appropriate. Exercise capacity was above average for age. Recommendations: Continue primary/secondary prevention.  Cardiac monitoring for 10 days at  PCP's office in September 2019-sinus rhythm, average heart rate-61 per minute, range-44-155/m. There were rare PVCs and PACs. Total 13 runs of SVT, longest run lasted for 17  beats at average heart rate 100/m. Fastest run consisted of 4 beats with maximum heart rate-154/m.    Recent labs: 05/02/2020: Glucose 99, BUN/Cr 24/0.9. EGFR N/A. HbA1C N/A Chol 157, TG 72, HDL 58, LDL 86 TSH 1.4 normal     Review of Systems  Cardiovascular:  Positive for chest pain. Negative for dyspnea on exertion, leg swelling, palpitations and syncope.        Vitals:   08/15/21 1122 08/15/21 1126  BP: (!) 142/90 (!) 138/91  Pulse: (!) 58 (!) 57  Resp: 16   Temp: 98 F (36.7 C)   SpO2: 96%      Body mass index is 22.73 kg/m. Filed Weights   08/15/21 1122  Weight: 177 lb (80.3 kg)     Objective:   Physical Exam Vitals and nursing note reviewed.  Constitutional:      General: He is not in acute distress. Cardiovascular:     Rate and Rhythm: Normal rate and regular rhythm.     Heart sounds: Normal heart sounds. No murmur heard. Pulmonary:     Effort: Pulmonary effort is normal.     Breath sounds: Normal breath sounds. No wheezing or rales.  Musculoskeletal:     Right lower leg: No edema.     Left lower leg: No edema.          Assessment & Recommendations:    76 y.o. Caucasian male with h/o PSVT, PAF  Chest pain: Focal precordial pain, worse with certain movements. EKG with no ischemic changes. Very unlikely to be ACS. Most likely musculoskeletal. Will check start troponin today. If HS trop significantly elevated, will recommend ER evaluation. If negative, can take ibuprofen. Will f/u in 1 week.   PAF: Maintaining sinus rhythm on flecainide 200 mg bid.  CHA2DS2VASc score 1, annual stroke risk 0.6%, therefore not on anticoagulation.  F/u in 1 week  Nigel Mormon, MD [facility] Pager: [phone] Office: [phone]

## 2021-08-16 LAB — TROPONIN T: Troponin T (Highly Sensitive): 8 ng/L (ref 0–22)

## 2021-08-22 ENCOUNTER — Ambulatory Visit: Payer: Medicare Other | Admitting: Cardiology

## 2021-10-08 ENCOUNTER — Ambulatory Visit: Payer: Medicare Other | Admitting: Cardiology

## 2021-10-15 ENCOUNTER — Ambulatory Visit: Payer: Medicare Other | Admitting: Cardiology

## 2021-10-15 ENCOUNTER — Encounter: Payer: Self-pay | Admitting: Cardiology

## 2021-10-15 VITALS — BP 139/79 | HR 54 | Temp 98.0°F | Resp 16 | Ht 74.0 in | Wt 185.0 lb

## 2021-10-15 DIAGNOSIS — I480 Paroxysmal atrial fibrillation: Secondary | ICD-10-CM | POA: Diagnosis not present

## 2021-10-15 DIAGNOSIS — R030 Elevated blood-pressure reading, without diagnosis of hypertension: Secondary | ICD-10-CM

## 2021-10-15 NOTE — Progress Notes (Signed)
? ?Follow up visit ? ?Subjective:  ? ?Austin Graves, male    DOB: Oct 14, 1945, 76 y.o.   MRN: [id number] ? ? ? ?HPI ? ?76 y.o. Caucasian male with h/o SVT, PAF ? ?Patient is doing well. He has not had any recurrent chest pain or Afib episodes.  ? ? ? ?Current Outpatient Medications:  ?  Calcium Carbonate (CALCIUM 600 PO), Take by mouth daily., Disp: , Rfl:  ?  Cholecalciferol (VITAMIN D3 PO), Take by mouth daily., Disp: , Rfl:  ?  flecainide (TAMBOCOR) 100 MG tablet, Take 1 tablet (100 mg total) by mouth as directed. Take 2 pills once as "pill in pocket" approach for occasional Afib episodes, Disp: 30 tablet, Rfl: 3 ?  ibuprofen (ADVIL,MOTRIN) 400 MG tablet, Take 400 mg by mouth every 6 (six) hours as needed for headache., Disp: , Rfl:  ?  LYSINE PO, Take 1 Dose by mouth daily., Disp: , Rfl:  ?  omeprazole (PRILOSEC) 20 MG capsule, Take 20 mg by mouth every Monday, Wednesday, and Friday., Disp: , Rfl:  ? ? ? ?Cardiovascular & other pertient studies: ? ?EKG 08/15/2021: ?Sinus rhythm 53 bpm ? ?Lexiscan Tetrofosmin stress test 04/08/2021: ?1 Day Rest/Stress Protocol. ?Stress EKG is non-diagnostic for ischemia as it's a pharmacologic stress test using Lexiscan. ?Normal myocardial perfusion without convincing evidence of reversible myocardial ischemia or prior infarct. ?LVEF per gated SPECT 52%, visually appears preserved. ?Left ventricular size mildly dilated (EDV 129 cc), no regional wall motion abnormalities, and wall thickness preserved. ?Low risk study. ?No prior studies available for comparison. ?  ?Echocardiogram 04/08/2021:  ?Normal LV systolic function with visual EF 55-60%. Left ventricle cavity  ?is normal in size. Mild left ventricular hypertrophy. Normal global wall  ?motion. Normal diastolic filling pattern, normal LAP.  ?Trace tricuspid regurgitation. No evidence of pulmonary hypertension.  ?Mild pulmonic regurgitation.  ?Compared to study 06/21/2019 no significant change.  ? ?Media Information ? ? ?Media  Information ? ? ?EKG 01/06/2021: ?Sinus rhythm 61 bpm  ?RSR(V1) -nondiagnostic ?Normal QTc interval ? ?Echocardiogram 06/21/2019: ?Left ventricle cavity is normal in size. Mild concentric hypertrophy of the left ventricle. Normal LV systolic function with EF 57%. Normal global wall motion. Normal diastolic filling pattern. 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  ?Trileaflet aortic valve. Mild (Grade I) aortic regurgitation. ?Mild (Grade I) mitral regurgitation. ?IVC is dilated with respiratory variation. Estimated RA pressure 8 mmHg. ?Aortic root 3.8 cm ?  ?Treadmill exercise stress test 05/06/2018: ?Indication: SVT ?The patient exercised on Bruce protocol for 12:57 min. Patient achieved 14.13 METS and reached HR 158 bpm, which is 104 % of maximum age-predicted HR. Stress test terminated due to fatigue. ?Resting EKG demonstrates Normal sinus rhythm. ST Changes: With peak exercise there was no ST-T changes of ischemia. Arrhythmias: none. ?BP Response to Exercise: Normal resting BP- exaggerated response. Resting 118/74 and max 120/102 in recovery at 1 mnute. ?ST Changes: With peak exercise there was no ST-T changes of ischemia. HR Response to Exercise: Appropriate. Exercise capacity was above average for age. Recommendations: Continue primary/secondary prevention. ? ?Cardiac monitoring for 10 days at [facility] office in September 2019-sinus rhythm, average heart rate-61 per minute, range-44-155/m. There were rare PVCs and PACs. Total 13 runs of SVT, longest run lasted for 17 beats at average heart rate 100/m. Fastest run consisted of 4 beats with maximum heart rate-154/m. ?  ? ?Recent labs: ?05/02/2020: ?Glucose 99, BUN/Cr 24/0.9. EGFR N/A. ?HbA1C N/A ?Chol 157, TG 72, HDL  58, LDL 86 ?TSH 1.4 normal ? ? ? ? ?Review of Systems  ?Cardiovascular:  Positive for chest pain. Negative for dyspnea on exertion, leg swelling, palpitations and syncope.  ? ?   ? ? ?Vitals:  ? 10/15/21  1328 10/15/21 1330  ?BP: (!) 168/94 139/79  ?Pulse: (!) 58 (!) 54  ?Resp: 16   ?Temp: 98 ?F (36.7 ?C)   ?SpO2: 99%   ? ? ? ?Body mass index is 23.75 kg/m?. ?Filed Weights  ? 10/15/21 1328  ?Weight: 185 lb (83.9 kg)  ? ? ? ?Objective:  ? Physical Exam ?Vitals and nursing note reviewed.  ?Constitutional:   ?   General: He is not in acute distress. ?Cardiovascular:  ?   Rate and Rhythm: Normal rate and regular rhythm.  ?   Heart sounds: Normal heart sounds. No murmur heard. ?Pulmonary:  ?   Effort: Pulmonary effort is normal.  ?   Breath sounds: Normal breath sounds. No wheezing or rales.  ?Musculoskeletal:  ?   Right lower leg: No edema.  ?   Left lower leg: No edema.  ? ? ? ?  ICD-10-CM   ?1. Elevated blood pressure reading without diagnosis of hypertension  R03.0   ?  ?2. PAF (paroxysmal atrial fibrillation) (HCC)  I48.0   ?  ? ? ? ?   ?Assessment & Recommendations:  ? ? ?76 y.o. Caucasian male with h/o PSVT, PAF ? ?Chest pain: ?Noncardiac, no recurrence.  ? ?PAF: ?Maintaining sinus rhythm on flecainide 200 mg bid.  ?CHA2DS2VASc score 1, annual stroke risk 0.6%, therefore not on anticoagulation. ? ?Elevated blood pressure without diagnosis of hypertension: ?Known white coat hypertension. Normal BP at home.  ? ?F/u in 6 months ? ?Nigel Mormon, MD ?[facility] ?Pager: [phone] ?Office: [phone] ?   ?

## 2021-12-08 ENCOUNTER — Encounter: Payer: Self-pay | Admitting: Cardiology

## 2021-12-08 NOTE — Telephone Encounter (Signed)
From patient.

## 2021-12-08 NOTE — Telephone Encounter (Signed)
From pt

## 2021-12-10 DIAGNOSIS — Z23 Encounter for immunization: Secondary | ICD-10-CM | POA: Diagnosis not present

## 2021-12-24 DIAGNOSIS — H903 Sensorineural hearing loss, bilateral: Secondary | ICD-10-CM | POA: Diagnosis not present

## 2021-12-24 DIAGNOSIS — H9313 Tinnitus, bilateral: Secondary | ICD-10-CM | POA: Diagnosis not present

## 2021-12-30 NOTE — Telephone Encounter (Signed)
From patient.

## 2022-01-06 NOTE — Telephone Encounter (Signed)
From patient.

## 2022-04-16 ENCOUNTER — Ambulatory Visit: Payer: Medicare Other | Admitting: Cardiology

## 2022-04-17 ENCOUNTER — Ambulatory Visit: Payer: Medicare Other | Admitting: Cardiology

## 2022-04-21 DIAGNOSIS — Z23 Encounter for immunization: Secondary | ICD-10-CM | POA: Diagnosis not present

## 2022-05-01 ENCOUNTER — Encounter: Payer: Self-pay | Admitting: Cardiology

## 2022-05-04 NOTE — Telephone Encounter (Signed)
From pt

## 2022-05-20 ENCOUNTER — Encounter: Payer: Self-pay | Admitting: Cardiology

## 2022-05-20 ENCOUNTER — Ambulatory Visit: Payer: Medicare Other | Admitting: Cardiology

## 2022-05-20 VITALS — BP 125/82 | HR 59 | Wt 181.0 lb

## 2022-05-20 DIAGNOSIS — I480 Paroxysmal atrial fibrillation: Secondary | ICD-10-CM | POA: Diagnosis not present

## 2022-05-20 NOTE — Progress Notes (Signed)
Follow up visit  Subjective:   Austin Graves, male    DOB: 03-11-1946, 76 y.o.   MRN: [id number]    HPI  76 y.o. Caucasian male with h/o PSVT, PAF  Patient is a Lawyer. He has noticed 8 episodes of Afib in year 2023, 3 episodes in last 2 months. All episodes resolve with as needed flecainide. Episodes only occur during night time, that he notices when he wakes up to go to the bathroom around 2 AM. He has had snoring in the past, which improved with using mouth guard recommended bu his dentist. He has never had sleep study. Given CHA2DS2VASc score of 1, he is not currently on anticoagulation.     Current Outpatient Medications:    Calcium Carbonate (CALCIUM 600 PO), Take by mouth daily., Disp: , Rfl:    Cholecalciferol (VITAMIN D3 PO), Take by mouth daily., Disp: , Rfl:    flecainide (TAMBOCOR) 100 MG tablet, Take 1 tablet (100 mg total) by mouth as directed. Take 2 pills once as "pill in pocket" approach for occasional Afib episodes, Disp: 30 tablet, Rfl: 3   ibuprofen (ADVIL,MOTRIN) 400 MG tablet, Take 400 mg by mouth every 6 (six) hours as needed for headache., Disp: , Rfl:    LYSINE PO, Take 1 Dose by mouth daily., Disp: , Rfl:    omeprazole (PRILOSEC) 20 MG capsule, Take 20 mg by mouth every Monday, Wednesday, and Friday., Disp: , Rfl:     Cardiovascular & other pertient studies:  EKG 05/20/2022: Sinus rhythm 55 bpm Normal EKG  EKG 08/15/2021: Sinus rhythm 53 bpm  Lexiscan Tetrofosmin stress test 04/08/2021: 1 Day Rest/Stress Protocol. Stress EKG is non-diagnostic for ischemia as it's a pharmacologic stress test using Lexiscan. Normal myocardial perfusion without convincing evidence of reversible myocardial ischemia or prior infarct. LVEF per gated SPECT 52%, visually appears preserved. Left ventricular size mildly dilated (EDV 129 cc), no regional wall motion abnormalities, and wall thickness preserved. Low risk study. No prior studies available for comparison.    Echocardiogram 04/08/2021:  Normal LV systolic function with visual EF 55-60%. Left ventricle cavity  is normal in size. Mild left ventricular hypertrophy. Normal global wall  motion. Normal diastolic filling pattern, normal LAP.  Trace tricuspid regurgitation. No evidence of pulmonary hypertension.  Mild pulmonic regurgitation.  Compared to study 06/21/2019 no significant change.   Media Information   Media Information   EKG 01/06/2021: Sinus rhythm 61 bpm  RSR(V1) -nondiagnostic Normal QTc interval  Echocardiogram 06/21/2019: Left ventricle cavity is normal in size. Mild concentric hypertrophy of the left ventricle. Normal LV systolic function with EF 57%. Normal global wall motion. Normal diastolic filling pattern. 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  Trileaflet aortic valve. Mild (Grade I) aortic regurgitation. Mild (Grade I) mitral regurgitation. 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 Estimated RA pressure 8 mmHg. Aortic root 3.8 cm   Treadmill exercise stress test 05/06/2018: Indication: SVT The patient exercised on Bruce protocol for 12:57 min. Patient achieved 14.13 METS and reached HR 158 bpm, which is 104 % of maximum age-predicted HR. Stress test terminated due to fatigue. Resting EKG demonstrates Normal sinus rhythm. ST Changes: With peak exercise there was no ST-T changes of ischemia. Arrhythmias: none. BP Response to Exercise: Normal resting BP- exaggerated response. Resting 118/74 and max 120/102 in recovery at 1 mnute. ST Changes: With peak exercise there was no ST-T changes of ischemia. HR Response to Exercise: Appropriate. Exercise capacity was above average  for age. Recommendations: Continue primary/secondary prevention.  Cardiac monitoring for 10 days at [facility] office in September 2019-sinus rhythm, average heart rate-61 per minute, range-44-155/m. There were rare PVCs and PACs. Total 13 runs of SVT, longest  run lasted for 17 beats at average heart rate 100/m. Fastest run consisted of 4 beats with maximum heart rate-154/m.    Recent labs: 05/02/2020: Glucose 99, BUN/Cr 24/0.9. EGFR N/A. HbA1C N/A Chol 157, TG 72, HDL 58, LDL 86 TSH 1.4 normal     Review of Systems  Cardiovascular:  Positive for palpitations. Negative for dyspnea on exertion, leg swelling and syncope.         Vitals:   05/20/22 1356  BP: 125/82  Pulse: (!) 59  SpO2: 98%     Body mass index is 23.24 kg/m. Filed Weights   05/20/22 1356  Weight: 181 lb (82.1 kg)     Objective:   Physical Exam Vitals and nursing note reviewed.  Constitutional:      General: He is not in acute distress. Cardiovascular:     Rate and Rhythm: Normal rate and regular rhythm.     Heart sounds: Normal heart sounds. No murmur heard. Pulmonary:     Effort: Pulmonary effort is normal.     Breath sounds: Normal breath sounds. No wheezing or rales.  Musculoskeletal:     Right lower leg: No edema.     Left lower leg: No edema.        ICD-10-CM   1. PAF (paroxysmal atrial fibrillation) (HCC)  I48.0 EKG 12-Lead          Assessment & Recommendations:    76 y.o. Caucasian male with h/o PSVT, PAF  PAF: 8 episodes of PAF in 2023 only at night, resolved with flecainide.  CHA2DS2VASc score 1, annual stroke risk 0.6%, therefore not on anticoagulation. Recommend sleep study. If recurrent symptoms continue, could consider maintenance flecainide or ablation referral. At age 62, his CHA2DS2VASc score will be 2. He wants to avoid anticoagulation due to high risk hobby of biking. I have mentioned to him about LAA occlusion. Will discuss again at next visit.    F/u in 3 months  [place], MD [facility] Pager: [phone] Office: [phone]

## 2022-05-26 DIAGNOSIS — R0981 Nasal congestion: Secondary | ICD-10-CM | POA: Diagnosis not present

## 2022-05-26 DIAGNOSIS — N401 Enlarged prostate with lower urinary tract symptoms: Secondary | ICD-10-CM | POA: Diagnosis not present

## 2022-05-26 DIAGNOSIS — Z125 Encounter for screening for malignant neoplasm of prostate: Secondary | ICD-10-CM | POA: Diagnosis not present

## 2022-05-26 DIAGNOSIS — E559 Vitamin D deficiency, unspecified: Secondary | ICD-10-CM | POA: Diagnosis not present

## 2022-05-26 DIAGNOSIS — E7800 Pure hypercholesterolemia, unspecified: Secondary | ICD-10-CM | POA: Diagnosis not present

## 2022-05-26 DIAGNOSIS — Z1331 Encounter for screening for depression: Secondary | ICD-10-CM | POA: Diagnosis not present

## 2022-05-26 DIAGNOSIS — Z0000 Encounter for general adult medical examination without abnormal findings: Secondary | ICD-10-CM | POA: Diagnosis not present

## 2022-05-26 DIAGNOSIS — H9193 Unspecified hearing loss, bilateral: Secondary | ICD-10-CM | POA: Diagnosis not present

## 2022-05-26 DIAGNOSIS — I480 Paroxysmal atrial fibrillation: Secondary | ICD-10-CM | POA: Diagnosis not present

## 2022-05-26 DIAGNOSIS — Z79899 Other long term (current) drug therapy: Secondary | ICD-10-CM | POA: Diagnosis not present

## 2022-05-26 DIAGNOSIS — R2232 Localized swelling, mass and lump, left upper limb: Secondary | ICD-10-CM | POA: Diagnosis not present

## 2022-05-26 DIAGNOSIS — R7303 Prediabetes: Secondary | ICD-10-CM | POA: Diagnosis not present

## 2022-05-26 DIAGNOSIS — Z23 Encounter for immunization: Secondary | ICD-10-CM | POA: Diagnosis not present

## 2022-07-04 ENCOUNTER — Encounter: Payer: Self-pay | Admitting: Cardiology

## 2022-07-06 NOTE — Telephone Encounter (Signed)
From patient.

## 2022-07-09 ENCOUNTER — Encounter: Payer: Self-pay | Admitting: Neurology

## 2022-07-09 ENCOUNTER — Ambulatory Visit (INDEPENDENT_AMBULATORY_CARE_PROVIDER_SITE_OTHER): Payer: Medicare Other | Admitting: Neurology

## 2022-07-09 VITALS — BP 139/79 | HR 58 | Ht 74.0 in | Wt 180.6 lb

## 2022-07-09 DIAGNOSIS — R0683 Snoring: Secondary | ICD-10-CM | POA: Diagnosis not present

## 2022-07-09 DIAGNOSIS — R351 Nocturia: Secondary | ICD-10-CM | POA: Diagnosis not present

## 2022-07-09 DIAGNOSIS — I480 Paroxysmal atrial fibrillation: Secondary | ICD-10-CM | POA: Diagnosis not present

## 2022-07-09 DIAGNOSIS — I471 Supraventricular tachycardia, unspecified: Secondary | ICD-10-CM | POA: Diagnosis not present

## 2022-07-09 DIAGNOSIS — I351 Nonrheumatic aortic (valve) insufficiency: Secondary | ICD-10-CM

## 2022-07-09 DIAGNOSIS — Z9189 Other specified personal risk factors, not elsewhere classified: Secondary | ICD-10-CM | POA: Diagnosis not present
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
## 2022-07-09 NOTE — Patient Instructions (Signed)

## 2022-07-27 NOTE — Telephone Encounter (Signed)
Discussed with the patient. Plan to continue as needed flecainide for now. You do not need to call the patient.   MJP

## 2022-08-20 ENCOUNTER — Ambulatory Visit: Payer: Medicare Other | Admitting: Cardiology

## 2022-08-20 ENCOUNTER — Encounter: Payer: Self-pay | Admitting: Cardiology

## 2022-08-20 VITALS — BP 164/86 | HR 55 | Resp 17 | Ht 74.0 in | Wt 180.8 lb

## 2022-08-20 DIAGNOSIS — I480 Paroxysmal atrial fibrillation: Secondary | ICD-10-CM

## 2022-08-20 MED ORDER — METOPROLOL TARTRATE 25 MG PO TABS: 25.0000 mg | ORAL_TABLET | ORAL | 1 refills | Status: DC

## 2022-08-20 MED ORDER — ASPIRIN 81 MG PO TBEC: 81.0000 mg | DELAYED_RELEASE_TABLET | ORAL | 1 refills | Status: AC

## 2022-08-20 NOTE — Progress Notes (Signed)
Follow up visit  Subjective:   Austin Graves, male    DOB: 1945-09-18, 77 y.o.   MRN: [id number]    HPI  77 y.o. Caucasian male with h/o PSVT, PAF  Patient is an avid biker. He continues to have roughly 2 episodes/3 months of Afib that only occurs during sleep and resolves with flecainide 100 mg as "pill in pocket" approach. He is going on a biking trip followed by vacation in [place] in April 2024. Sleep study is pending. He is currently not on anticoagulation. Blood pressure elevated today, but is high only during medical visits. Blood pressure at home with a well calibrated machine is always<130/80 mmHg.     Current Outpatient Medications:    Calcium Carbonate (CALCIUM 600 PO), Take by mouth daily., Disp: , Rfl:    Cholecalciferol (VITAMIN D3 PO), Take by mouth daily., Disp: , Rfl:    flecainide (TAMBOCOR) 100 MG tablet, Take 1 tablet (100 mg total) by mouth as directed. Take 2 pills once as "pill in pocket" approach for occasional Afib episodes, Disp: 30 tablet, Rfl: 3   ibuprofen (ADVIL) 200 MG tablet, Take 200 mg by mouth every 6 (six) hours as needed., Disp: , Rfl:    LYSINE PO, Take 1 Dose by mouth daily., Disp: , Rfl:    omeprazole (PRILOSEC) 20 MG capsule, Take 20 mg by mouth every Monday, Wednesday, and Friday., Disp: , Rfl:     Cardiovascular & other pertient studies:  EKG 05/20/2022: Sinus rhythm 55 bpm Normal EKG  EKG 08/15/2021: Sinus rhythm 53 bpm  Lexiscan Tetrofosmin stress test 04/08/2021: 1 Day Rest/Stress Protocol. Stress EKG is non-diagnostic for ischemia as it's a pharmacologic stress test using Lexiscan. Normal myocardial perfusion without convincing evidence of reversible myocardial ischemia or prior infarct. LVEF per gated SPECT 52%, visually appears preserved. Left ventricular size mildly dilated (EDV 129 cc), no regional wall motion abnormalities, and wall thickness preserved. Low risk study. No prior studies available for comparison.    Echocardiogram 04/08/2021:  Normal LV systolic function with visual EF 55-60%. Left ventricle cavity  is normal in size. Mild left ventricular hypertrophy. Normal global wall  motion. Normal diastolic filling pattern, normal LAP.  Trace tricuspid regurgitation. No evidence of pulmonary hypertension.  Mild pulmonic regurgitation.  Compared to study 06/21/2019 no significant change.   Media Information   Media Information   EKG 01/06/2021: Sinus rhythm 61 bpm  RSR(V1) -nondiagnostic Normal QTc interval  Echocardiogram 06/21/2019: Left ventricle cavity is normal in size. Mild concentric hypertrophy of the left ventricle. Normal LV systolic function with EF 57%. Normal global wall motion. Normal diastolic filling pattern. 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  Trileaflet aortic valve. Mild (Grade I) aortic regurgitation. Mild (Grade I) mitral regurgitation. 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 Estimated RA pressure 8 mmHg. Aortic root 3.8 cm   Treadmill exercise stress test 05/06/2018: Indication: SVT The patient exercised on Bruce protocol for 12:57 min. Patient achieved 14.13 METS and reached HR 158 bpm, which is 104 % of maximum age-predicted HR. Stress test terminated due to fatigue. Resting EKG demonstrates Normal sinus rhythm. ST Changes: With peak exercise there was no ST-T changes of ischemia. Arrhythmias: none. BP Response to Exercise: Normal resting BP- exaggerated response. Resting 118/74 and max 120/102 in recovery at 1 mnute. ST Changes: With peak exercise there was no ST-T changes of ischemia. HR Response to Exercise: Appropriate. Exercise capacity was above average for age. Recommendations: Continue primary/secondary  prevention.  Cardiac monitoring for 10 days at [facility] office in September 2019-sinus rhythm, average heart rate-61 per minute, range-44-155/m. There were rare PVCs and PACs. Total 13 runs of SVT, longest  run lasted for 17 beats at average heart rate 100/m. Fastest run consisted of 4 beats with maximum heart rate-154/m.    Recent labs: 05/02/2020: Glucose 99, BUN/Cr 24/0.9. EGFR N/A. HbA1C N/A Chol 157, TG 72, HDL 58, LDL 86 TSH 1.4 normal     Review of Systems  Cardiovascular:  Negative for dyspnea on exertion, leg swelling, palpitations and syncope.         Vitals:   08/20/22 1247  BP: (!) 164/86  Pulse: (!) 55  Resp: 17  SpO2: 96%     Body mass index is 23.21 kg/m. Filed Weights   08/20/22 1247  Weight: 180 lb 12.8 oz (82 kg)     Objective:   Physical Exam Vitals and nursing note reviewed.  Constitutional:      General: He is not in acute distress. Cardiovascular:     Rate and Rhythm: Normal rate and regular rhythm.     Heart sounds: Normal heart sounds. No murmur heard. Pulmonary:     Effort: Pulmonary effort is normal.     Breath sounds: Normal breath sounds. No wheezing or rales.  Musculoskeletal:     Right lower leg: No edema.     Left lower leg: No edema.      No diagnosis found.       Assessment & Recommendations:    77 y.o. Caucasian male with h/o PSVT, PAF  PAF: Roughly 2 episodes/3 months only at night, always resolve with flecainide 100 mg..  CHA2DS2VASc score 1, annual stroke risk 0.6%, therefore not on anticoagulation. Recommend sleep study given nocturnal nature of episodes, sleep study is pending. In the past, patient's snoring has resolved by using an oral retainer.   I have added metoprolol 25 mg twice daily to be taken 12 hours apart only when he takes flecainide.  Hopefully, this will reduce the risk of any atrial organized rhythm conducting 1: 1, especially during his biking trip.  We did discuss alternate ways of rhythm control.  He does not wish to be on daily medication, if possible.  Given his upcoming back procedure such as ablation as it could impact his training.  In addition, I am optimistic that if he does indeed  have sleep study, CPAP could potentially have significant improvement in his nocturnal A-fib results.  Finally, we discussed stroke risk.  His CHA2DS2-VASc score at this time is 1, with annual stroke risk of around 1% (age 59-74.  Blood pressure elevated today, but very well-controlled at home and does not have hypertension as such).  That said, his stroke risk will only increase as his age increases, statistically speaking  >2% as early as 06/28/2023 when he will turn 33. With him being an avid biker and often having minor injuries/falls, anticoagulation is not ideal for him. I reckon he may benefit from LAA closure with Watchman device. We will discuss this again during upcoming office visits. In the meantime, it would be reasonable to take Aspirin 81 mg daily, which he will takje the days he is not training.   Time spent: 40 min  F/u in 3 months   Nigel Mormon, MD Pager: [phone] Office: [phone]

## 2022-08-21 ENCOUNTER — Encounter: Payer: Self-pay | Admitting: Cardiology

## 2022-08-21 NOTE — Telephone Encounter (Signed)
From patient

## 2022-08-25 ENCOUNTER — Telehealth: Payer: Self-pay | Admitting: Neurology

## 2022-08-25 NOTE — Telephone Encounter (Signed)
Sent mychart message

## 2022-09-15 ENCOUNTER — Encounter: Payer: Self-pay | Admitting: Cardiology

## 2022-09-15 NOTE — Telephone Encounter (Signed)
From patient

## 2022-09-17 NOTE — Telephone Encounter (Signed)
From patient

## 2022-09-24 DIAGNOSIS — Z23 Encounter for immunization: Secondary | ICD-10-CM | POA: Diagnosis not present

## 2022-10-05 NOTE — Telephone Encounter (Signed)
From patient.

## 2022-10-06 ENCOUNTER — Ambulatory Visit (INDEPENDENT_AMBULATORY_CARE_PROVIDER_SITE_OTHER): Payer: Medicare Other | Admitting: Neurology

## 2022-10-06 DIAGNOSIS — G4733 Obstructive sleep apnea (adult) (pediatric): Secondary | ICD-10-CM

## 2022-10-06 DIAGNOSIS — I471 Supraventricular tachycardia, unspecified: Secondary | ICD-10-CM

## 2022-10-06 DIAGNOSIS — I480 Paroxysmal atrial fibrillation: Secondary | ICD-10-CM

## 2022-10-06 DIAGNOSIS — R0683 Snoring: Secondary | ICD-10-CM

## 2022-10-06 DIAGNOSIS — G4720 Circadian rhythm sleep disorder, unspecified type: Secondary | ICD-10-CM

## 2022-10-06 DIAGNOSIS — I351 Nonrheumatic aortic (valve) insufficiency: Secondary | ICD-10-CM

## 2022-10-06 DIAGNOSIS — R351 Nocturia: Secondary | ICD-10-CM

## 2022-10-06 DIAGNOSIS — Z9189 Other specified personal risk factors, not elsewhere classified: Secondary | ICD-10-CM

## 2022-10-06 NOTE — Telephone Encounter (Signed)
From patient.

## 2022-10-13 NOTE — Procedures (Signed)
Physician Interpretation:     [facility] POLYSOMNOGRAPHY  INTERPRETATION REPORT   STUDY DATE:  10/06/2022     PATIENT NAME:  Austin Graves         DATE OF BIRTH:  01-01-1946  PATIENT ID:  [id number]    TYPE OF STUDY:  PSG  READING PHYSICIAN: Star Age, MD, PhD   SCORING TECHNICIAN: Gaylyn Cheers, RPSGT  Referred by: Dr. Jerilynn Mages. Patwardhan  History and Indication for Testing: 77 year old male with an underlying medical history of reflux disease, paroxysmal A-fib, PSVT, mild aortic regurgitation, cholelithiasis with status post cholecystectomy in July 2022, who reports snoring and episodes of atrial fibrillation. His Epworth sleepiness score is 1 out of 24, fatigue severity score is 18 out of 63. Height: 74 in Weight: 180 lb (BMI 23) Neck Size: 16 in   MEDICATIONS: Calcium, Vitamin D3, Tambocor, Advil, Lysine, Prilosec  TECHNICAL DESCRIPTION: A registered sleep technologist was in attendance for the duration of the recording.   Data collection, scoring, video monitoring, and reporting were performed in compliance with the AASM Manual for the Scoring of Sleep and Associated Events; (Hypopnea is scored based on the criteria listed in Section VIII D. 1b in the AASM Manual V2.6 using a 4% oxygen desaturation rule or Hypopnea is scored based on the criteria listed in Section VIII D. 1a in the AASM Manual V2.6 using 3% oxygen desaturation and /or arousal rule).   SLEEP CONTINUITY AND SLEEP ARCHITECTURE:  Lights-out was at 21:55: and lights-on at  05:06:, with a total recording time of 7 hours, 11 min. Total sleep time ( TST) was 277.5 minutes with a decreased sleep efficiency at 64.4%. There was  9.0% REM sleep.   BODY POSITION:  TST was divided  between the following sleep positions: 36.8% supine;  63.2% lateral;  0% prone. Duration of total sleep and percent of total sleep in their respective position is as follows: supine 102 minutes (37%), non-supine 176 minutes  (63%); right 175 minutes (63%), left 00 minutes (0%), and prone 00 minutes (0%).  Total supine REM sleep time was 03 minutes (12% of total REM sleep).  Sleep latency was increased at 58.0 minutes.  REM sleep latency was increased at 170.0 minutes. Of the total sleep time, the percentage of stage N1 sleep was 20.2%, which is increased, stage N2 sleep was 71%, which is markedly increased, stage N3 sleep was absent, and REM sleep was 9.0%, which is reduced. Wake after sleep onset (WASO) time accounted for 95.5 minutes with mild to moderate sleep fragmentation noted.   RESPIRATORY MONITORING:  Based on CMS criteria (using a 4% oxygen desaturation rule for scoring hypopneas), there were 47 apneas (47 obstructive; 0 central; 0 mixed), and 4 hypopneas.  Apnea index was 10.2. Hypopnea index was 0.9. The apnea-hypopnea index was 11.0/hour overall (30.0 supine, 0 non-supine; 0.0 REM, 0.0 supine REM).  There were 0 respiratory effort-related arousals (RERAs).  The RERA index was 0 events/h. Total respiratory disturbance index (RDI) was 11.0 events/h. RDI results showed: supine RDI  30.0 /h; non-supine RDI 0.0 /h; REM RDI 0.0 /h, supine REM RDI 0.0 /h.   Based on AASM criteria (using a 3% oxygen desaturation and /or arousal rule for scoring hypopneas), there were 47 apneas (47 obstructive; 0 central; 0 mixed), and 7 hypopneas. Apnea index was 10.2. Hypopnea index was 1.5. The apnea-hypopnea index was 11.7 overall (31.8 supine, 0 non-supine; 0.0 REM, 0.0 supine REM).  There were 0 respiratory effort-related arousals (  RERAs).  The RERA index was 0 events/h. Total respiratory disturbance index (RDI) was 11.7 events/h. RDI results showed: supine RDI  31.8 /h; non-supine RDI 0.0 /h; REM RDI 0.0 /h, supine REM RDI 0.0 /h.   OXIMETRY: Oxyhemoglobin Saturation Nadir during sleep was at  88% from a mean of 94%.  Of the Total sleep time (TST)   hypoxemia (=<88%) was present for  0.1 minutes, or 0.0% of total sleep time.    LIMB MOVEMENTS: There were 61 periodic limb movements of sleep (13.2/hr), of which 1 (0.2/hr) were associated with an arousal.  AROUSAL: There were 92 arousals in total, for an arousal index of 20 arousals/hour.  Of these, 40 were identified as respiratory-related arousals (9 /h), 1 were PLM-related arousals (0 /h), and 77 were non-specific arousals (17 /h).  EEG: Review of the EEG showed no abnormal electrical discharges and symmetrical bihemispheric findings.    EKG: The EKG revealed normal sinus rhythm (NSR). The average heart rate during sleep was 47 bpm.   AUDIO/VIDEO REVIEW: The audio and video review did not show any abnormal or unusual behaviors, movements, phonations or vocalizations. The patient took one restroom break. Snoring was intermittent, in the mild range.  POST-STUDY QUESTIONNAIRE: Post study, the patient indicated, that sleep was worse than usual.   IMPRESSION:  1. Mild Obstructive Sleep Apnea (OSA) 2. Dysfunctions associated with sleep stages or arousal from sleep  RECOMMENDATIONS:  1. This study demonstrates overall mild obstructive sleep apnea, more pronounced in supine sleep, with a total AHI of 11/hour, and O2 nadir of 88%. Given the patient's medical history and sleep related complaints, treatment with positive airway pressure can be considered; this can be achieved in the form of autoPAP. A full-night CPAP titration study would allow optimization of therapy if needed. Other treatment options may include the use of an oral appliance. The patient has, in fact, a dentist made oral appliance (originally made for snoring). Please note, that untreated obstructive sleep apnea may carry additional perioperative morbidity. Patients with significant obstructive sleep apnea should receive perioperative PAP therapy and the surgeons and particularly the anesthesiologist should be informed of the diagnosis and the severity of the sleep disordered breathing. 2. This study shows  sleep fragmentation and abnormal sleep stage percentages; these are nonspecific findings and per se do not signify an intrinsic sleep disorder or a cause for the patient's sleep-related symptoms. Causes include (but are not limited to) the first night effect of the sleep study, circadian rhythm disturbances, medication effect or an underlying mood disorder or medical problem.  3. The patient should be cautioned not to drive, work at heights, or operate dangerous or heavy equipment when tired or sleepy. Review and reiteration of good sleep hygiene measures should be pursued with any patient. 4. The patient will be seen in follow-up by Dr. Rexene Alberts at [facility] for discussion of the test results and further management strategies. The referring provider will be notified of the test results.    I certify that I have reviewed the entire raw data recording prior to the issuance of this report in accordance with the Standards of Accreditation of the American Academy of Sleep Medicine (AASM).  Star Age, MD, PhD Medical Director, [facility]) [place], [place] (Neurology and Sleep)               Technical Report:   General Information  Name: Austin Graves, Austin Graves BMI: 23.11 Physician: Star Age, MD  ID: [id number] Height: 74.0 in  Technician: Gaylyn Cheers, RPSGT  Sex: Male Weight: 180.0 lb Record: [id number]  Age: 77 [January 30, 1946] Date: 10/06/2022    Medical & Medication History    Mr. Thelin is a 77 year old male with an underlying medical history of reflux disease, paroxysmal A-fib, PSVT, mild aortic regurgitation, cholelithiasis with status post cholecystectomy in July 2022, who reports snoring and episodes of atrial fibrillation, also captured on his Apple Watch. His Epworth sleepiness score is 1 out of 24, fatigue severity score is 18 out of 63. He has symptoms of irregular heartbeat, most of these episodes have happened at night or in the early morning hours.  He has a history of snoring and has been using an oral appliance (Silent Nite) through his dentist for the past 10 or 12 years. Last year he got a new appliance. He has never had a formal sleep study or a sleep study with the appliance in place. This was primarily for disturbing snoring.  Calcium, Vitamin D3, Tambocor, Advil, Lysine, Prilosec   Sleep Disorder      Comments   Patient arrived for a diagnostic polysomnogram. Procedure explained and all questions answered. Standard paste setup without complications. Patient slept supine, left, and right.. Occasional mild snoring heard. A few respiratory events observed. No obvious cardiac arrhythmias observed. Mild PLMS observed. One restroom visit.     Lights out: 09:55:47 PM Lights on: 05:06:48 AM   Time Total Supine Side Prone Upright  Recording (TRT) 7h 11.47m 3h 18.73m 3h 52.36m 0h 0.59m 0h 0.59m  Sleep (TST) 4h 37.13m 1h 42.63m 2h 55.63m 0h 0.37m 0h 0.51m   Latency N1 N2 N3 REM Onset Per. Slp. Eff.  Actual 0h 0.40m 0h 4.66m 0h 0.40m 2h 50.5m 0h 58.52m 1h 19.65m 64.39%   Stg Dur Wake N1 N2 N3 REM  Total 153.5 56.0 196.5 0.0 25.0  Supine 96.5 36.5 62.5 0.0 3.0  Side 57.0 19.5 134.0 0.0 22.0  Prone 0.0 0.0 0.0 0.0 0.0  Upright 0.0 0.0 0.0 0.0 0.0   Stg % Wake N1 N2 N3 REM  Total 35.6 20.2 70.8 0.0 9.0  Supine 22.4 13.2 22.5 0.0 1.1  Side 13.2 7.0 48.3 0.0 7.9  Prone 0.0 0.0 0.0 0.0 0.0  Upright 0.0 0.0 0.0 0.0 0.0     Apnea Summary Sub Supine Side Prone Upright  Total 47 Total 47 47 0 0 0    REM 0 0 0 0 0    NREM 47 47 0 0 0  Obs 47 REM 0 0 0 0 0    NREM 47 47 0 0 0  Mix 0 REM 0 0 0 0 0    NREM 0 0 0 0 0  Cen 0 REM 0 0 0 0 0    NREM 0 0 0 0 0   Rera Summary Sub Supine Side Prone Upright  Total 0 Total 0 0 0 0 0    REM 0 0 0 0 0    NREM 0 0 0 0 0   Hypopnea Summary Sub Supine Side Prone Upright  Total 7 Total 7 7 0 0 0    REM 0 0 0 0 0    NREM 7 7 0 0 0   4% Hypopnea Summary Sub Supine Side Prone Upright  Total (4%) 4 Total 4 4 0 0  0    REM 0 0 0 0 0    NREM 4 4 0 0 0     AHI Total Obs Mix Cen  11.68  Apnea 10.16 10.16 0.00 0.00   Hypopnea 1.51 -- -- --  11.03 Hypopnea (4%) 0.86 -- -- --    Total Supine Side Prone Upright  Position AHI 11.68 31.76 0.00 0.00 0.00  REM AHI 0.00   NREM AHI 12.83   Position RDI 11.68 31.76 0.00 0.00 0.00  REM RDI 0.00   NREM RDI 12.83    4% Hypopnea Total Supine Side Prone Upright  Position AHI (4%) 11.03 30.00 0.00 0.00 0.00  REM AHI (4%) 0.00   NREM AHI (4%) 12.12   Position RDI (4%) 11.03 30.00 0.00 0.00 0.00  REM RDI (4%) 0.00   NREM RDI (4%) 12.12    Desaturation Information Threshold: 2% <100% <90% <80% <70% <60% <50% <40%  Supine 132.0 3.0 0.0 0.0 0.0 0.0 0.0  Side 24.0 1.0 0.0 0.0 0.0 0.0 0.0  Prone 0.0 0.0 0.0 0.0 0.0 0.0 0.0  Upright 0.0 0.0 0.0 0.0 0.0 0.0 0.0  Total 156.0 4.0 0.0 0.0 0.0 0.0 0.0  Index 25.1 0.6 0.0 0.0 0.0 0.0 0.0   Threshold: 3% <100% <90% <80% <70% <60% <50% <40%  Supine 66.0 3.0 0.0 0.0 0.0 0.0 0.0  Side 3.0 1.0 0.0 0.0 0.0 0.0 0.0  Prone 0.0 0.0 0.0 0.0 0.0 0.0 0.0  Upright 0.0 0.0 0.0 0.0 0.0 0.0 0.0  Total 69.0 4.0 0.0 0.0 0.0 0.0 0.0  Index 11.1 0.6 0.0 0.0 0.0 0.0 0.0   Threshold: 4% <100% <90% <80% <70% <60% <50% <40%  Supine 40.0 3.0 0.0 0.0 0.0 0.0 0.0  Side 1.0 1.0 0.0 0.0 0.0 0.0 0.0  Prone 0.0 0.0 0.0 0.0 0.0 0.0 0.0  Upright 0.0 0.0 0.0 0.0 0.0 0.0 0.0  Total 41.0 4.0 0.0 0.0 0.0 0.0 0.0  Index 6.6 0.6 0.0 0.0 0.0 0.0 0.0   Threshold: 3% <100% <90% <80% <70% <60% <50% <40%  Supine 66 3 0 0 0 0 0  Side 3 1 0 0 0 0 0  Prone 0 0 0 0 0 0 0  Upright 0 0 0 0 0 0 0  Total 69 4 0 0 0 0 0   Awakening/Arousal Information # of Awakenings 47  Wake after sleep onset 95.86m  Wake after persistent sleep 91.40m   Arousal Assoc. Arousals Index  Apneas 36 7.8  Hypopneas 4 0.9  Leg Movements 4 0.9  Snore 0 0.0  PTT Arousals 0 0.0  Spontaneous 78 16.9  Total 122 26.4  Leg Movement Information PLMS LMs Index  Total LMs  during PLMS 61 13.2  LMs w/ Microarousals 1 0.2   LM LMs Index  w/ Microarousal 3 0.6  w/ Awakening 1 0.2  w/ Resp Event 0 0.0  Spontaneous 39 8.4  Total 42 9.1     Desaturation threshold setting: 3% Minimum desaturation setting: 10 seconds SaO2 nadir: 80% The longest event was a 60 sec obstructive Apnea with a minimum SaO2 of 90%. The lowest SaO2 was 87% associated with a 50 sec obstructive Apnea. EKG Rates EKG Avg Max Min  Awake 52 83 42  Asleep 47 71 41  EKG Events: N/A

## 2022-10-15 ENCOUNTER — Telehealth: Payer: Self-pay | Admitting: *Deleted

## 2022-10-15 NOTE — Telephone Encounter (Signed)
-----   Message from Star Age, MD sent at 10/13/2022 12:31 PM EDT ----- Patient referred by Dr. Virgina Jock, seen by me on 07/09/22, diagnostic PSG on 10/06/22.    Please call and notify the patient that the recent sleep study showed overall mild obstructive sleep apnea. Treatment can be considered with a CPAP like machine, called autoPAP or ongoing treatment with a dental device is also an option.  He already has an oral appliance through his dentist and can maintain treatment with the appliance and FU with his dentist on a regular basis. If he would like to consider treatment with an autoPAP machine, I would be happy to prescribe a machine and we can send the order to a DME company (of his choice, or as per insurance requirement). The DME representative will educate him on how to use the machine, how to put the mask on, etc.  Let me know, how he would like to proceed.   Star Age, MD, PhD [facility])

## 2022-10-15 NOTE — Telephone Encounter (Signed)
Spoke with patient briefly. He was getting ready to meet some people so he will call us back either later today or Monday.

## 2022-10-19 NOTE — Telephone Encounter (Signed)
The patient called me back and we discussed his sleep study results.  The patient states at this time he prefers to continue using his oral appliance.  He states he has been using it every night and has had it for the last 10 years.  He states his wife tells him he snores occasionally but for the most part he does fine in does not wake up catching his breath.  He will give [place] a call back if he changes his mind and wants to try AutoPap.  He verbalized appreciation for the call.  Report sent to referring provider.

## 2022-11-25 ENCOUNTER — Encounter: Payer: Self-pay | Admitting: Cardiology

## 2022-11-25 NOTE — Telephone Encounter (Signed)
From patient.

## 2022-11-26 NOTE — Telephone Encounter (Signed)
From patient.

## 2022-11-30 DIAGNOSIS — H2512 Age-related nuclear cataract, left eye: Secondary | ICD-10-CM | POA: Diagnosis not present

## 2022-11-30 DIAGNOSIS — H2513 Age-related nuclear cataract, bilateral: Secondary | ICD-10-CM | POA: Diagnosis not present

## 2022-11-30 DIAGNOSIS — H25013 Cortical age-related cataract, bilateral: Secondary | ICD-10-CM | POA: Diagnosis not present

## 2022-11-30 DIAGNOSIS — H25043 Posterior subcapsular polar age-related cataract, bilateral: Secondary | ICD-10-CM | POA: Diagnosis not present

## 2022-12-02 DIAGNOSIS — R7309 Other abnormal glucose: Secondary | ICD-10-CM | POA: Diagnosis not present

## 2022-12-02 DIAGNOSIS — R972 Elevated prostate specific antigen [PSA]: Secondary | ICD-10-CM | POA: Diagnosis not present

## 2022-12-16 NOTE — Telephone Encounter (Signed)
From patient.

## 2022-12-24 NOTE — Telephone Encounter (Signed)
From Patient

## 2023-01-05 DIAGNOSIS — H2511 Age-related nuclear cataract, right eye: Secondary | ICD-10-CM | POA: Diagnosis not present

## 2023-01-05 DIAGNOSIS — H25011 Cortical age-related cataract, right eye: Secondary | ICD-10-CM | POA: Diagnosis not present

## 2023-01-05 DIAGNOSIS — H25041 Posterior subcapsular polar age-related cataract, right eye: Secondary | ICD-10-CM | POA: Diagnosis not present

## 2023-01-05 DIAGNOSIS — Z9849 Cataract extraction status, unspecified eye: Secondary | ICD-10-CM | POA: Diagnosis not present

## 2023-01-05 DIAGNOSIS — H2512 Age-related nuclear cataract, left eye: Secondary | ICD-10-CM | POA: Diagnosis not present

## 2023-01-05 DIAGNOSIS — Z961 Presence of intraocular lens: Secondary | ICD-10-CM | POA: Diagnosis not present

## 2023-01-05 DIAGNOSIS — H269 Unspecified cataract: Secondary | ICD-10-CM | POA: Diagnosis not present

## 2023-01-11 NOTE — Telephone Encounter (Signed)
From pt

## 2023-01-12 DIAGNOSIS — Z961 Presence of intraocular lens: Secondary | ICD-10-CM | POA: Diagnosis not present

## 2023-01-12 DIAGNOSIS — H2511 Age-related nuclear cataract, right eye: Secondary | ICD-10-CM | POA: Diagnosis not present

## 2023-01-12 DIAGNOSIS — H2512 Age-related nuclear cataract, left eye: Secondary | ICD-10-CM | POA: Diagnosis not present

## 2023-01-12 DIAGNOSIS — Z9849 Cataract extraction status, unspecified eye: Secondary | ICD-10-CM | POA: Diagnosis not present

## 2023-01-19 DIAGNOSIS — H2511 Age-related nuclear cataract, right eye: Secondary | ICD-10-CM | POA: Diagnosis not present

## 2023-01-19 DIAGNOSIS — H269 Unspecified cataract: Secondary | ICD-10-CM | POA: Diagnosis not present

## 2023-01-26 DIAGNOSIS — Z961 Presence of intraocular lens: Secondary | ICD-10-CM | POA: Diagnosis not present

## 2023-01-26 DIAGNOSIS — H2511 Age-related nuclear cataract, right eye: Secondary | ICD-10-CM | POA: Diagnosis not present

## 2023-01-26 DIAGNOSIS — Z9841 Cataract extraction status, right eye: Secondary | ICD-10-CM | POA: Diagnosis not present

## 2023-01-26 DIAGNOSIS — Z9842 Cataract extraction status, left eye: Secondary | ICD-10-CM | POA: Diagnosis not present

## 2023-02-18 ENCOUNTER — Ambulatory Visit: Payer: Medicare Other | Admitting: Cardiology

## 2023-03-08 ENCOUNTER — Ambulatory Visit: Payer: Medicare Other | Admitting: Cardiology

## 2023-03-08 ENCOUNTER — Encounter: Payer: Self-pay | Admitting: Cardiology

## 2023-03-08 VITALS — BP 119/74 | HR 58 | Resp 16 | Ht 74.0 in | Wt 178.0 lb

## 2023-03-08 DIAGNOSIS — I480 Paroxysmal atrial fibrillation: Secondary | ICD-10-CM | POA: Diagnosis not present

## 2023-03-08 NOTE — Progress Notes (Signed)
Follow up visit  Subjective:   Austin Graves, male    DOB: 09-07-1945, 77 y.o.   MRN: [id number]    HPI  77 y.o. Caucasian male with h/o PSVT, PAF  Patient is an avid biker. On an average, he has one Afib episode/month that immediate resolves after taking flecainide as a "pill in pocket" approach..  In the past, he has tried to take metoprolol when he takes recommended, as recommended by me.  However, he has had heart rates in 30s, associated with lightheadedness when he took metoprolol.  Therefore, we mutually decided to skip metoprolol.     Current Outpatient Medications:    aspirin EC 81 MG tablet, Take 1 tablet (81 mg total) by mouth as directed. Take on days you are not riding/exercising, Disp: 90 tablet, Rfl: 1   Calcium Carbonate (CALCIUM 600 PO), Take by mouth daily., Disp: , Rfl:    Cholecalciferol (VITAMIN D3 PO), Take by mouth daily., Disp: , Rfl:    flecainide (TAMBOCOR) 100 MG tablet, Take 1 tablet (100 mg total) by mouth as directed. Take 2 pills once as "pill in pocket" approach for occasional Afib episodes, Disp: 30 tablet, Rfl: 3   ibuprofen (ADVIL) 200 MG tablet, Take 200 mg by mouth every 6 (six) hours as needed., Disp: , Rfl:    LYSINE PO, Take 1 Dose by mouth daily., Disp: , Rfl:    metoprolol tartrate (LOPRESSOR) 25 MG tablet, Take 1 tablet (25 mg total) by mouth as directed. Take 1 pill when you take flecainide, repeat in another 12 hours, Disp: 60 tablet, Rfl: 1   omeprazole (PRILOSEC) 20 MG capsule, Take 20 mg by mouth every Monday, Wednesday, and Friday., Disp: , Rfl:     Cardiovascular & other pertient studies:  EKG 03/08/2023: Sinus bradycardia 54 bpm  Lexiscan Tetrofosmin stress test 04/08/2021: 1 Day Rest/Stress Protocol. Stress EKG is non-diagnostic for ischemia as it's a pharmacologic stress test using Lexiscan. Normal myocardial perfusion without convincing evidence of reversible myocardial ischemia or prior infarct. LVEF per gated SPECT 52%,  visually appears preserved. Left ventricular size mildly dilated (EDV 129 cc), no regional wall motion abnormalities, and wall thickness preserved. Low risk study. No prior studies available for comparison.   Echocardiogram 04/08/2021:  Normal LV systolic function with visual EF 55-60%. Left ventricle cavity  is normal in size. Mild left ventricular hypertrophy. Normal global wall  motion. Normal diastolic filling pattern, normal LAP.  Trace tricuspid regurgitation. No evidence of pulmonary hypertension.  Mild pulmonic regurgitation.  Compared to study 06/21/2019 no significant change.   Media Information   Media Information   EKG 01/06/2021: Sinus rhythm 61 bpm  RSR(V1) -nondiagnostic Normal QTc interval  Echocardiogram 06/21/2019: Left ventricle cavity is normal in size. Mild concentric hypertrophy of the left ventricle. Normal LV systolic function with EF 57%. Normal global wall motion. Normal diastolic filling pattern. 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  Trileaflet aortic valve. Mild (Grade I) aortic regurgitation. Mild (Grade I) mitral regurgitation. 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 Estimated RA pressure 8 mmHg. Aortic root 3.8 cm   Treadmill exercise stress test 05/06/2018: Indication: SVT The patient exercised on Bruce protocol for 12:57 min. Patient achieved 14.13 METS and reached HR 158 bpm, which is 104 % of maximum age-predicted HR. Stress test terminated due to fatigue. Resting EKG demonstrates Normal sinus rhythm. ST Changes: With peak exercise there was no ST-T changes of ischemia. Arrhythmias: none. BP Response to  Exercise: Normal resting BP- exaggerated response. Resting 118/74 and max 120/102 in recovery at 1 mnute. ST Changes: With peak exercise there was no ST-T changes of ischemia. HR Response to Exercise: Appropriate. Exercise capacity was above average for age. Recommendations: Continue  primary/secondary prevention.  Cardiac monitoring for 10 days at [facility] office in September 2019-sinus rhythm, average heart rate-61 per minute, range-44-155/m. There were rare PVCs and PACs. Total 13 runs of SVT, longest run lasted for 17 beats at average heart rate 100/m. Fastest run consisted of 4 beats with maximum heart rate-154/m.    Recent labs: 05/02/2020: Glucose 99, BUN/Cr 24/0.9. EGFR N/A. HbA1C N/A Chol 157, TG 72, HDL 58, LDL 86 TSH 1.4 normal     Review of Systems  Cardiovascular:  Positive for palpitations. Negative for dyspnea on exertion, leg swelling and syncope.         Vitals:   03/08/23 1409  BP: 119/74  Pulse: (!) 58  Resp: 16  SpO2: 98%      Body mass index is 22.85 kg/m. Filed Weights   03/08/23 1409  Weight: 178 lb (80.7 kg)      Objective:   Physical Exam Vitals and nursing note reviewed.  Constitutional:      General: He is not in acute distress. Cardiovascular:     Rate and Rhythm: Normal rate and regular rhythm.     Heart sounds: Normal heart sounds. No murmur heard. Pulmonary:     Effort: Pulmonary effort is normal.     Breath sounds: Normal breath sounds. No wheezing or rales.  Musculoskeletal:     Right lower leg: No edema.     Left lower leg: No edema.    Visit diagnoses:    ICD-10-CM   1. PAF (paroxysmal atrial fibrillation) (HCC)  I48.0 EKG 12-Lead           Assessment & Recommendations:    77 y.o. Caucasian male with h/o PSVT, PAF  PAF: Roughly 1 episodes/month, that always resolve with flecainide 100 mg. We have decided not to use metoprolol with flecainide administration, as as he has had episodes of symptomatic bradycardia with metoprolol. CHA2DS2VASc score 1, annual stroke risk 0.6%, therefore not on anticoagulation. His score and annual stroke risk were increased to 2, and 2.2% respectively as he turns 77 this December. I discussed risks, benefits and alternative options to anticoagulation, including  left atrial appendage closure.  Given his high injury risk hobby of mountain biking, this would be a reasonable option.  He wants to hold off either anticoagulation or left atrial appendage closure at this time.  Will discuss further at next visit in 6 months, when he will be 77.  F/u in 3 months   Elder Negus, MD Pager: [phone] Office: [phone]

## 2023-04-02 ENCOUNTER — Encounter: Payer: Self-pay | Admitting: Cardiology

## 2023-04-02 DIAGNOSIS — I480 Paroxysmal atrial fibrillation: Secondary | ICD-10-CM

## 2023-04-02 NOTE — Telephone Encounter (Signed)
From patient.

## 2023-04-06 DIAGNOSIS — Z23 Encounter for immunization: Secondary | ICD-10-CM | POA: Diagnosis not present

## 2023-04-26 NOTE — Telephone Encounter (Signed)
Reviewed. I do think we should get EP input given that Afib episodes seem to happen more frequently now. If patient is agreeable, we should proceed with EP referral.

## 2023-04-27 NOTE — Addendum Note (Signed)
Addended by: Judene Companion on: 04/27/2023 08:17 AM   Modules accepted: Orders

## 2023-06-01 ENCOUNTER — Encounter: Payer: Self-pay | Admitting: Cardiology

## 2023-06-01 ENCOUNTER — Ambulatory Visit: Payer: Medicare Other | Attending: Cardiology | Admitting: Cardiology

## 2023-06-01 VITALS — BP 148/80 | HR 55 | Ht 74.0 in | Wt 184.0 lb

## 2023-06-01 DIAGNOSIS — D6869 Other thrombophilia: Secondary | ICD-10-CM

## 2023-06-01 DIAGNOSIS — I480 Paroxysmal atrial fibrillation: Secondary | ICD-10-CM

## 2023-06-01 DIAGNOSIS — R030 Elevated blood-pressure reading, without diagnosis of hypertension: Secondary | ICD-10-CM

## 2023-06-01 MED ORDER — FLECAINIDE ACETATE 100 MG PO TABS: 100.0000 mg | ORAL_TABLET | Freq: Two times a day (BID) | ORAL | 3 refills | Status: DC

## 2023-06-01 NOTE — Patient Instructions (Signed)
Medication Instructions:  Your physician has recommended you make the following change in your medication:  1) START taking flecainide 100 mg twice daily   *If you need a refill on your cardiac medications before your next appointment, please call your pharmacy*  Testing/Procedures: Your physician has requested that you have an exercise tolerance test. For further information please visit [email or web address] Please also follow instruction sheet, as given.  Follow-Up: At [facility], you and your health needs are our priority.  As part of our continuing mission to provide you with exceptional heart care, we have created designated Provider Care Teams.  These Care Teams include your primary Cardiologist (physician) and Advanced Practice Providers (APPs -  Physician Assistants and Nurse Practitioners) who all work together to provide you with the care you need, when you need it.  Your next appointment:   4-5 months  Provider:   Nobie Putnam, MD

## 2023-06-01 NOTE — Progress Notes (Signed)
Electrophysiology Office Note:   Date:  06/01/2023  ID:  Austin Graves, DOB March 17, 1946, MRN [id number]  Primary Cardiologist: None Electrophysiologist: Nobie Putnam, MD      History of Present Illness:   Austin Graves is a 77 y.o. male with h/o paroxysmal atrial fibrillation who is being seen today for evaluation of atrial fibrillation at the request of Dr.Patwardhan.  Patient reports a history of atrial fibrillation dating back to at least 2022.  He states that almost all of his episodes have incurred at night.  Often he will wake up in the middle of the night to go to the bathroom and will have palpitations and feel irregular/skipped beats.  He will then put on his Apple Watch which has confirmed presence of atrial fibrillation.  He then will take 100 mg of flecainide and he estimates that approximately an hour later his atrial fibrillation will resolve.  These episodes have increased in frequency significantly over the past 2 years.  He is otherwise doing well and has no new or acute complaints.  He is a avid cyclist estimating that he bikes for approximately 15 hours a week.  He plans to do this for as long as he can.   Review of systems complete and found to be negative unless listed in HPI.   EP Information / Studies Reviewed:    EKG is not ordered today. EKG from 03/08/23 reviewed which showed sinus bradycardia.      Apple Watch:       [facility] cardiovascular echo 04/08/2021: Normal LV size and function.  LVEF 55 to 60%. No significant valvular disease mention.  04/08/2021: No ischemia or infarction.  LVEF 52%.  Risk Assessment/Calculations:    CHA2DS2-VASc Score = 1   This indicates a 0.6% annual risk of stroke. The patient's score is based upon: CHF History: 0 HTN History: 0 Diabetes History: 0 Stroke History: 0 Vascular Disease History: 0 Age Score: 1 Gender Score: 0     Physical Exam:   VS:  BP (!) 148/80   Pulse (!) 55   Ht 6\' 2"  (1.88 m)   Wt 184 lb  (83.5 kg)   SpO2 98%   BMI 23.62 kg/m    Wt Readings from Last 3 Encounters:  06/01/23 184 lb (83.5 kg)  03/08/23 178 lb (80.7 kg)  08/20/22 180 lb 12.8 oz (82 kg)     GEN: Well nourished, well developed in no acute distress NECK: No JVD; No carotid bruits CARDIAC: Bradycardic, regular rhythm RESPIRATORY:  Clear to auscultation without rales, wheezing or rhonchi  ABDOMEN: Soft, non-tender, non-distended EXTREMITIES:  No edema; No deformity   ASSESSMENT AND PLAN:   Austin Graves is a 77 y.o. male with h/o paroxysmal atrial fibrillation who is being seen today for evaluation of atrial fibrillation at the request of Dr.Patwardhan.  #. Paroxysmal atrial fibrillation, symptomatic: Likely vagally mediated.  Events predominantly occurred during sleep.  He is an avid cyclist with a low resting heart rate and likely high vagal tone. -Given that his episodes are increasing in frequency, it seems reasonable to increase his flecainide from pill in the pocket which she has been doing frequently to scheduled 100 mg twice daily.  He will return for exercise EKG testing in 1 week to look for QRS widening and flecainide. -Patient would be an appropriate candidate for catheter ablation.  Discussed treatment options today for AF including antiarrhythmic drug therapy and ablation. Discussed risks, recovery and likelihood of success with each treatment  strategy. Risk, benefits, and alternatives to EP study and ablation for afib were discussed. These risks include but are not limited to stroke, bleeding, vascular damage, tamponade, perforation, damage to the esophagus, lungs, phrenic nerve and other structures, pulmonary vein stenosis, worsening renal function, coronary vasospasm and death.  Discussed potential need for repeat ablation procedures and antiarrhythmic drugs after an initial ablation. The patient understands these risk and wishes to proceed scheduled flecainide dosing for now.  #.  Secondary  hypercoagulable state due to atrial fibrillation: -His CHA2DS2-VASc score is 1.  He currently takes a baby aspirin nightly. -He will soon turn 77 and at that point his CHA2DS2-VASc score will be 2.  I have encouraged him at that point it would be appropriate to start oral anticoagulation.  Patient is a cyclist and is concerned about being on a blood thinner.  For this reason I have given him information about the Watchman device implant for which I believe he would be reasonable candidate.  #.  White coat hypertension: Patient's blood pressure is elevated in clinic today.  It is often elevated when he sees physician.  He showed me a detailed report of frequent blood pressure checks at home and they were all within normal range. -Continue frequent home monitoring  Follow up with Dr. Jimmey Ralph  in 4-5 months.    Total time of encounter: 69 minutes total time of encounter, including chart review, face-to-face patient care, coordination of care and counseling regarding high complexity medical decision making.   Signed, Nobie Putnam, MD

## 2023-06-09 ENCOUNTER — Ambulatory Visit: Payer: Medicare Other | Attending: Cardiology

## 2023-06-09 DIAGNOSIS — I480 Paroxysmal atrial fibrillation: Secondary | ICD-10-CM

## 2023-06-09 LAB — EXERCISE TOLERANCE TEST
Angina Index: 0
Duke Treadmill Score: 12
Estimated workload: 13.7
Exercise duration (min): 11 min
Exercise duration (sec): 44 s
MPHR: 146 {beats}/min
Peak HR: 125 {beats}/min
Percent HR: 85 %
RPE: 14
Rest HR: 51 {beats}/min
ST Depression (mm): 0 mm

## 2023-07-15 DIAGNOSIS — H9193 Unspecified hearing loss, bilateral: Secondary | ICD-10-CM | POA: Diagnosis not present

## 2023-07-15 DIAGNOSIS — E7800 Pure hypercholesterolemia, unspecified: Secondary | ICD-10-CM | POA: Diagnosis not present

## 2023-07-15 DIAGNOSIS — G629 Polyneuropathy, unspecified: Secondary | ICD-10-CM | POA: Diagnosis not present

## 2023-07-15 DIAGNOSIS — R490 Dysphonia: Secondary | ICD-10-CM | POA: Diagnosis not present

## 2023-07-15 DIAGNOSIS — Z1331 Encounter for screening for depression: Secondary | ICD-10-CM | POA: Diagnosis not present

## 2023-07-15 DIAGNOSIS — Z23 Encounter for immunization: Secondary | ICD-10-CM | POA: Diagnosis not present

## 2023-07-15 DIAGNOSIS — I480 Paroxysmal atrial fibrillation: Secondary | ICD-10-CM | POA: Diagnosis not present

## 2023-07-15 DIAGNOSIS — R2232 Localized swelling, mass and lump, left upper limb: Secondary | ICD-10-CM | POA: Diagnosis not present

## 2023-07-15 DIAGNOSIS — R7303 Prediabetes: Secondary | ICD-10-CM | POA: Diagnosis not present

## 2023-07-15 DIAGNOSIS — Z0000 Encounter for general adult medical examination without abnormal findings: Secondary | ICD-10-CM | POA: Diagnosis not present

## 2023-07-15 DIAGNOSIS — N401 Enlarged prostate with lower urinary tract symptoms: Secondary | ICD-10-CM | POA: Diagnosis not present

## 2023-07-15 DIAGNOSIS — R0981 Nasal congestion: Secondary | ICD-10-CM | POA: Diagnosis not present

## 2023-07-15 DIAGNOSIS — Z125 Encounter for screening for malignant neoplasm of prostate: Secondary | ICD-10-CM | POA: Diagnosis not present

## 2023-07-15 DIAGNOSIS — Z79899 Other long term (current) drug therapy: Secondary | ICD-10-CM | POA: Diagnosis not present

## 2023-07-15 DIAGNOSIS — E559 Vitamin D deficiency, unspecified: Secondary | ICD-10-CM | POA: Diagnosis not present

## 2023-09-08 ENCOUNTER — Ambulatory Visit: Payer: Medicare Other | Admitting: Cardiology

## 2023-10-05 DIAGNOSIS — H53143 Visual discomfort, bilateral: Secondary | ICD-10-CM | POA: Diagnosis not present

## 2023-10-05 DIAGNOSIS — Z9849 Cataract extraction status, unspecified eye: Secondary | ICD-10-CM | POA: Diagnosis not present

## 2023-10-05 DIAGNOSIS — Z961 Presence of intraocular lens: Secondary | ICD-10-CM | POA: Diagnosis not present

## 2023-10-08 DIAGNOSIS — I480 Paroxysmal atrial fibrillation: Secondary | ICD-10-CM | POA: Diagnosis not present

## 2023-10-15 ENCOUNTER — Encounter: Payer: Self-pay | Admitting: Cardiology

## 2023-10-15 ENCOUNTER — Ambulatory Visit: Payer: Medicare Other | Attending: Cardiology | Admitting: Cardiology

## 2023-10-15 VITALS — BP 128/80 | HR 48 | Ht 74.0 in | Wt 186.6 lb

## 2023-10-15 DIAGNOSIS — R030 Elevated blood-pressure reading, without diagnosis of hypertension: Secondary | ICD-10-CM | POA: Insufficient documentation

## 2023-10-15 DIAGNOSIS — D6869 Other thrombophilia: Secondary | ICD-10-CM | POA: Insufficient documentation

## 2023-10-15 DIAGNOSIS — I480 Paroxysmal atrial fibrillation: Secondary | ICD-10-CM | POA: Diagnosis not present

## 2023-10-15 MED ORDER — METOPROLOL TARTRATE 25 MG PO TABS: 25.0000 mg | ORAL_TABLET | Freq: Every day | ORAL | 3 refills | Status: DC | PRN

## 2023-10-15 NOTE — Patient Instructions (Signed)
 Medication Instructions:  Your physician recommends that you continue on your current medications as directed. Please refer to the Current Medication list given to you today.  *If you need a refill on your cardiac medications before your next appointment, please call your pharmacy*  Follow-Up: At [facility], you and your health needs are our priority.  As part of our continuing mission to provide you with exceptional heart care, our providers are all part of one team.  This team includes your primary Cardiologist (physician) and Advanced Practice Providers or APPs (Physician Assistants and Nurse Practitioners) who all work together to provide you with the care you need, when you need it.  Your next appointment:   6 months  Provider:   You may see Nobie Putnam, MD or one of the following Advanced Practice Providers on your designated Care Team:   Francis Dowse, [place] Casimiro Needle "Mardelle Matte" [place], [place] Sherie Don, NP Canary Brim, NP         1st Floor: - Lobby - Registration  - Pharmacy  - Lab - Cafe  2nd Floor: - PV Lab - Diagnostic Testing (echo, CT, nuclear med)  3rd Floor: - Vacant  4th Floor: - TCTS (cardiothoracic surgery) - AFib Clinic - Structural Heart Clinic - Vascular Surgery  - Vascular Ultrasound  5th Floor: - HeartCare Cardiology (general and EP) - Clinical Pharmacy for coumadin, hypertension, lipid, weight-loss medications, and med management appointments    Valet parking services will be available as well.

## 2023-10-15 NOTE — Progress Notes (Signed)
 Electrophysiology Office Note:   Date:  10/15/2023  ID:  Austin, Graves 1945/11/07, MRN [id number]  Primary Cardiologist: None Electrophysiologist: Nobie Putnam, MD      History of Present Illness:   Austin Graves is a 78 y.o. male with h/o paroxysmal atrial fibrillation who is being seen today for follow up evaluation.  Discussed the use of AI scribe software for clinical note transcription with the patient, who gave verbal consent to proceed.  History of Present Illness Austin Graves is a 78 year old male with atrial fibrillation who presents for follow-up after medication adjustment. He has a history of atrial fibrillation and was previously taking flecainide on an as-needed basis. He previously experienced approximately twelve episodes of atrial fibrillation, occurring once a month, primarily at night. Because of this we changed to a scheduled regimen of flecainide 100mg  twice daily at his last visit. He reports no episodes of atrial fibrillation since starting this regimen. He underwent a exercise ECG test after the medication change, which showed no issues.He monitors his heart rhythm using a watch, which he wears during the day and while cycling. He discovered his atrial fibrillation episodes at night when he would wake up and check his pulse with the watch. He has not experienced any alerts for irregular heart rate during the day. He cycles 12 to 15 hours a week and is concerned about the risk of bleeding if he were to start a blood thinner, given the potential for falls while cycling. He has noticed a reduction in his maximum heart rate while cycling, from the 130s to around 120, but reports no change in his ability to perform the activity, although he climbs hills slower.   Review of systems complete and found to be negative unless listed in HPI.   EP Information / Studies Reviewed:    EKG is ordered today. Personal review as below.  EKG Interpretation Date/Time:  Friday  October 15 2023 11:23:51 EDT Ventricular Rate:  48 PR Interval:  222 QRS Duration:  110 QT Interval:  478 QTC Calculation: 427 R Axis:   89  Text Interpretation: Sinus bradycardia with 1st degree A-V block When compared with ECG of 21-Sep-2020 20:07, PREVIOUS ECG IS PRESENT Reconfirmed by Nobie Putnam [id number]) on 10/15/2023 2:17:23 PM     Apple Watch:         [facility] echo 04/08/2021: Normal LV size and function.  LVEF 55 to 60%. No significant valvular disease mention.   04/08/2021: No ischemia or infarction.  LVEF 52%.  Risk Assessment/Calculations:    CHA2DS2-VASc Score = 2   This indicates a 2.2% annual risk of stroke. The patient's score is based upon: CHF History: 0 HTN History: 0 Diabetes History: 0 Stroke History: 0 Vascular Disease History: 0 Age Score: 2 Gender Score: 0             Physical Exam:   VS:  BP 128/80   Pulse (!) 48   Ht 6\' 2"  (1.88 m)   Wt 186 lb 9.6 oz (84.6 kg)   SpO2 97%   BMI 23.96 kg/m    Wt Readings from Last 3 Encounters:  10/15/23 186 lb 9.6 oz (84.6 kg)  06/01/23 184 lb (83.5 kg)  03/08/23 178 lb (80.7 kg)     GEN: Well nourished, well developed in no acute distress NECK: No JVD CARDIAC: Bradycardic, regular RESPIRATORY:  Clear to auscultation without rales, wheezing or rhonchi  ABDOMEN: Soft, non-distended EXTREMITIES:  No edema; No  deformity   ASSESSMENT AND PLAN:   Austin Graves is a 78 y.o. male with h/o paroxysmal atrial fibrillation who is being seen today for evaluation of atrial fibrillation at the request of Dr.Patwardhan.   #. Paroxysmal atrial fibrillation, symptomatic: Likely vagally mediated.  Events predominantly occurred during sleep.  He has had no events since changing to scheduled flecainide 100mg  twice daily.  #. High risk medication use: Flecainide.  - ETT test done 05/2023 with no QRS widening.  - ECG today with sinus bradycardia, PR , and QRS .  - Continue flecainide 100mg   BID.  - Metoprolol tartrate 25mg  as needed.   #.  Secondary hypercoagulable state due to atrial fibrillation: His CHA2DS2-VASc score is now 2 (age only).  - We had an extensive discussion regarding risks and benefits of oral anti-coagulation. Based on CHADSVASC of 2 our formal recommendation would be for [facility] for stroke prophylaxis. However, he is a cyclist and believes his risk of anti-coagulation would be higher than benefit at present. He is currently not having any episodes of AF and is monitoring his heart rate/rhythm continuously with an Apple Watch. If he has recurrence of AF then we will start [facility] or pursue Watchman.    #.  White coat hypertension: Patient's blood pressure is normal in clinic today.  -Continue frequent home monitoring.  Follow up with Dr. Jimmey Ralph in 6 months.  Signed, Nobie Putnam, MD

## 2023-10-18 DIAGNOSIS — N401 Enlarged prostate with lower urinary tract symptoms: Secondary | ICD-10-CM | POA: Diagnosis not present

## 2023-10-18 DIAGNOSIS — I480 Paroxysmal atrial fibrillation: Secondary | ICD-10-CM | POA: Diagnosis not present

## 2023-10-18 DIAGNOSIS — E7800 Pure hypercholesterolemia, unspecified: Secondary | ICD-10-CM | POA: Diagnosis not present

## 2023-10-20 ENCOUNTER — Encounter: Payer: Self-pay | Admitting: Cardiology

## 2023-10-20 ENCOUNTER — Ambulatory Visit: Payer: Medicare Other | Attending: Cardiology | Admitting: Cardiology

## 2023-10-20 VITALS — BP 136/80 | HR 47 | Resp 16 | Ht 74.0 in | Wt 186.4 lb

## 2023-10-20 DIAGNOSIS — E7800 Pure hypercholesterolemia, unspecified: Secondary | ICD-10-CM | POA: Diagnosis not present

## 2023-10-20 DIAGNOSIS — I471 Supraventricular tachycardia, unspecified: Secondary | ICD-10-CM | POA: Diagnosis not present

## 2023-10-20 DIAGNOSIS — I480 Paroxysmal atrial fibrillation: Secondary | ICD-10-CM | POA: Insufficient documentation

## 2023-10-20 NOTE — Patient Instructions (Signed)
 Medication Instructions:   Your physician recommends that you continue on your current medications as directed. Please refer to the Current Medication list given to you today.  *If you need a refill on your cardiac medications before your next appointment, please call your pharmacy*   Testing/Procedures:  CARDIAC CALCIUM SCORE (SELF PAY)   Follow-Up:  NEEDS FOLLOW-UP SCHEDULED WITH DR. PARKER IN SEPTEMBER (PER RECALL AND DR. PATWARDHAN)       1st Floor: - Lobby - Registration  - Pharmacy  - Lab - Cafe  2nd Floor: - PV Lab - Diagnostic Testing (echo, CT, nuclear med)  3rd Floor: - Vacant  4th Floor: - TCTS (cardiothoracic surgery) - AFib Clinic - Structural Heart Clinic - Vascular Surgery  - Vascular Ultrasound  5th Floor: - HeartCare Cardiology (general and EP) - Clinical Pharmacy for coumadin, hypertension, lipid, weight-loss medications, and med management appointments    Valet parking services will be available as well.

## 2023-10-20 NOTE — Progress Notes (Signed)
  Cardiology Office Note:  .   Date:  10/20/2023  ID:  Austin Graves, Austin Graves 1946-06-04, MRN [id number] PCP: Emilio Aspen, MD  [facility] Providers Cardiologist:  Truett Mainland, MD PCP: Emilio Aspen, MD  Chief Complaint  Patient presents with   Atrial Fibrillation   Follow-up     Austin Graves is a 78 y.o. male with PSVT, PAF   Patient was seen by EP Dr. Jimmey Ralph in 09/2023.  He was recommended continued medical therapy with flecainide 100 mg twice daily, and metoprolol tartrate 25 mg only as needed in the setting of resting sinus bradycardia.   He has been doing well, and has not had any recurrent A-fib recently.  Given that he is an avid cyclist, he has chosen not to be on anticoagulation and is not interested in pursuing LAAC at this time.  He takes aspirin 81 mg daily.  Reviewed recent lab results with the patient, details below.    Vitals:   10/20/23 1037  BP: 136/80  Pulse: (!) 47  Resp: 16  SpO2: 98%      Review of Systems  Cardiovascular:  Negative for chest pain, dyspnea on exertion, leg swelling, palpitations and syncope.        Studies Reviewed: Marland Kitchen        EKG 10/15/2023: Sinus bradycardia 48 bpm First degree AV block  Independently interpreted 06/2023: Chol 181, TG 93, HDL 60, LDL 104 HbA1C 5.7% Hb 14.9 Cr 0.7 TSH 1.7  Risk Assessment/Calculations:    CHA2DS2-VASc Score = 2  This indicates a 2.2% annual risk of stroke. The patient's score is based upon: CHF History: 0 HTN History: 0 Diabetes History: 0 Stroke History: 0 Vascular Disease History: 0 Age Score: 2 Gender Score: 0     Physical Exam Vitals and nursing note reviewed.  Constitutional:      General: He is not in acute distress. Neck:     Vascular: No JVD.  Cardiovascular:     Rate and Rhythm: Normal rate and regular rhythm.     Heart sounds: Normal heart sounds. No murmur heard. Pulmonary:     Effort: Pulmonary effort is normal.     Breath  sounds: Normal breath sounds. No wheezing or rales.  Musculoskeletal:     Right lower leg: No edema.     Left lower leg: No edema.      VISIT DIAGNOSES:   ICD-10-CM   1. PAF (paroxysmal atrial fibrillation) (HCC)  I48.0     2. PSVT (paroxysmal supraventricular tachycardia) (HCC)  I47.10     3. Elevated LDL cholesterol level  E78.00       Assessment & Plan:  Austin Graves is a 78 y.o. male with PSVT, PAF, elevated LDL  PAF: Regular control on primary 100 mg twice daily. Given resting heart rate in 40s, he only takes metoprolol as needed if heart rate is elevated. No QRS widening on exercise treadmill stress test in 05/2023.   Given that he is an avid cyclist, he has chosen not to be on anticoagulation due to bleeding risk, and is not interested in [place] at this time. Continue aspirin 81 mg daily.  Elevated LDL: Will check calcium score scan for risk stratification.   Continue follow-up with Dr. Jimmey Ralph. F/u me as needed  Signed, Elder Negus, MD

## 2023-10-26 ENCOUNTER — Telehealth: Payer: Self-pay | Admitting: *Deleted

## 2023-10-26 NOTE — Telephone Encounter (Signed)
-----   Message from [place] [facility] sent at 10/22/2023  1:47 PM EDT ----- Regarding: RE: APPT WITH DR. PARKER IN SEPT PER DR. PATWARDHAN Reached out to patient to schedule recall for 6 month f/u in September with Parker/EP APP - no answer, lvmtcb and MyChart message sent! ----- Message ----- From: Loa Socks, LPN Sent: 03/27/1190   4:33 PM EDT To: Ronn Melena; Noe Gens; # Subject: FW: APPT WITH DR. PARKER IN SEPT PER DR. PAT#   ----- Message ----- From: Loa Socks, LPN Sent: 10/24/8293  11:33 AM EDT To: Ronn Melena; Noe Gens; # Subject: APPT WITH DR. PARKER IN SEPT PER DR. PATWARD#  Dr. Rosemary Holms saw this pt in clinic today and wanted to make sure he gets his follow-up appt with Dr. Jimmey Ralph scheduled in Sept.   Can you help with this?   Thanks friends, Lajoyce Corners

## 2023-11-03 ENCOUNTER — Ambulatory Visit (HOSPITAL_COMMUNITY)
Admission: RE | Admit: 2023-11-03 | Discharge: 2023-11-03 | Disposition: A | Discharge status: Home / Self Care | Payer: Self-pay | Source: Ambulatory Visit | Attending: Cardiology | Admitting: Cardiology

## 2023-11-03 DIAGNOSIS — E7800 Pure hypercholesterolemia, unspecified: Secondary | ICD-10-CM | POA: Insufficient documentation

## 2023-11-05 NOTE — Progress Notes (Signed)
 Mildly elevated calcium  in 1 artery.  LDL is 104.  Patient already lives a very healthy diet and lifestyle. Recommend adding Crestor  20 mg daily to reduce lifetime risk of coronary artery disease, heart attack.  Thanks MJP

## 2023-11-06 DIAGNOSIS — I480 Paroxysmal atrial fibrillation: Secondary | ICD-10-CM | POA: Diagnosis not present

## 2023-11-10 ENCOUNTER — Telehealth: Payer: Self-pay | Admitting: Cardiology

## 2023-11-10 NOTE — Telephone Encounter (Signed)
 Pt returning call, requesting cb w/ cardiac CT results. Ok to leave on vm

## 2023-11-11 ENCOUNTER — Other Ambulatory Visit: Payer: Self-pay

## 2023-11-11 MED ORDER — ROSUVASTATIN CALCIUM 20 MG PO TABS: 20.0000 mg | ORAL_TABLET | Freq: Every day | ORAL | 3 refills | Status: AC

## 2023-11-11 NOTE — Telephone Encounter (Signed)
Review result note

## 2023-11-16 ENCOUNTER — Encounter: Payer: Self-pay | Admitting: Cardiology

## 2023-11-17 DIAGNOSIS — E7800 Pure hypercholesterolemia, unspecified: Secondary | ICD-10-CM | POA: Diagnosis not present

## 2023-11-17 DIAGNOSIS — I480 Paroxysmal atrial fibrillation: Secondary | ICD-10-CM | POA: Diagnosis not present

## 2023-11-17 DIAGNOSIS — N401 Enlarged prostate with lower urinary tract symptoms: Secondary | ICD-10-CM | POA: Diagnosis not present

## 2023-12-06 DIAGNOSIS — I480 Paroxysmal atrial fibrillation: Secondary | ICD-10-CM | POA: Diagnosis not present

## 2023-12-18 DIAGNOSIS — N401 Enlarged prostate with lower urinary tract symptoms: Secondary | ICD-10-CM | POA: Diagnosis not present

## 2023-12-18 DIAGNOSIS — I480 Paroxysmal atrial fibrillation: Secondary | ICD-10-CM | POA: Diagnosis not present

## 2023-12-18 DIAGNOSIS — E7800 Pure hypercholesterolemia, unspecified: Secondary | ICD-10-CM | POA: Diagnosis not present

## 2024-01-05 DIAGNOSIS — I480 Paroxysmal atrial fibrillation: Secondary | ICD-10-CM | POA: Diagnosis not present

## 2024-01-17 DIAGNOSIS — I480 Paroxysmal atrial fibrillation: Secondary | ICD-10-CM | POA: Diagnosis not present

## 2024-01-17 DIAGNOSIS — N401 Enlarged prostate with lower urinary tract symptoms: Secondary | ICD-10-CM | POA: Diagnosis not present

## 2024-01-17 DIAGNOSIS — E7800 Pure hypercholesterolemia, unspecified: Secondary | ICD-10-CM | POA: Diagnosis not present

## 2024-02-04 DIAGNOSIS — I480 Paroxysmal atrial fibrillation: Secondary | ICD-10-CM | POA: Diagnosis not present

## 2024-02-17 DIAGNOSIS — I480 Paroxysmal atrial fibrillation: Secondary | ICD-10-CM | POA: Diagnosis not present

## 2024-02-17 DIAGNOSIS — N401 Enlarged prostate with lower urinary tract symptoms: Secondary | ICD-10-CM | POA: Diagnosis not present

## 2024-02-17 DIAGNOSIS — E7800 Pure hypercholesterolemia, unspecified: Secondary | ICD-10-CM | POA: Diagnosis not present

## 2024-03-05 DIAGNOSIS — I480 Paroxysmal atrial fibrillation: Secondary | ICD-10-CM | POA: Diagnosis not present

## 2024-03-19 DIAGNOSIS — I480 Paroxysmal atrial fibrillation: Secondary | ICD-10-CM | POA: Diagnosis not present

## 2024-03-19 DIAGNOSIS — N401 Enlarged prostate with lower urinary tract symptoms: Secondary | ICD-10-CM | POA: Diagnosis not present

## 2024-03-19 DIAGNOSIS — E7800 Pure hypercholesterolemia, unspecified: Secondary | ICD-10-CM | POA: Diagnosis not present

## 2024-03-30 NOTE — Progress Notes (Unsigned)
 Electrophysiology Office Note:   Date:  04/01/2024  ID:  Austin Graves, DOB 11-Feb-1946, MRN [id number]  Primary Cardiologist: None Electrophysiologist: Fonda Kitty, MD      History of Present Illness:   Austin Graves is a 78 y.o. male with h/o paroxysmal atrial fibrillation who is being seen today for follow up evaluation.  Discussed the use of AI scribe software for clinical note transcription with the patient, who gave verbal consent to proceed.  History of Present Illness  Austin Graves is a 78 year old male with atrial fibrillation who presents for a routine six-month follow-up.  He was previously on an as-needed regimen of flecainide  but transitioned to a daily regimen in November due to increased frequency of atrial fibrillation episodes, occurring about once a month. Since starting daily flecainide , he has experienced no episodes of atrial fibrillation.  He monitors his blood pressure daily at home using a cuff provided by his general practitioner, which uploads results to a site. At home, his blood pressure readings are generally fine, although he notes higher readings in the clinic, consistent with a history of white coat hypertension. 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  His resting heart rate has decreased from the low fifties to the mid-forties since starting daily flecainide . He also observes a decrease in his maximum heart rate from 140 to 120, but he is still able to perform his cycling activities without issue.  He occasionally experiences shortness of breath after climbing stairs, a symptom he has had for years, even in his forties.  This is not consistent.  He denies any other exertional shortness of breath and exercises regularly.   Review of systems complete and found to be negative unless listed in HPI.   EP Information / Studies Reviewed:    EKG is ordered today. Personal review as below.  EKG  Interpretation Date/Time:  Friday March 31 2024 10:04:08 EDT Ventricular Rate:  51 PR Interval:  208 QRS Duration:  110 QT Interval:  488 QTC Calculation: 449 R Axis:   91  Text Interpretation: Sinus bradycardia Rightward axis When compared with ECG of 15-Oct-2023 11:23, No significant change was found Confirmed by Kitty Fonda [phone]) on 03/31/2024 10:39:13 AM     Apple Watch:         [facility] cardiovascular echo 04/08/2021: Normal LV size and function.  LVEF 55 to 60%. No significant valvular disease mention.   04/08/2021: No ischemia or infarction.  LVEF 52%.  Risk Assessment/Calculations:    CHA2DS2-VASc Score = 2   This indicates a 2.2% annual risk of stroke. The patient's score is based upon: CHF History: 0 HTN History: 0 Diabetes History: 0 Stroke History: 0 Vascular Disease History: 0 Age Score: 2 Gender Score: 0             Physical Exam:   VS:  BP 134/81 (BP Location: Left Arm, Patient Position: Sitting, Cuff Size: Normal)   Pulse (!) 51   Ht 6' 2 (1.88 m)   Wt 181 lb (82.1 kg)   SpO2 97%   BMI 23.24 kg/m    Wt Readings from Last 3 Encounters:  03/31/24 181 lb (82.1 kg)  10/20/23 186 lb 6.4 oz (84.6 kg)  10/15/23 186 lb 9.6 oz (84.6 kg)     GEN: Well nourished, well developed in no acute distress NECK: No JVD CARDIAC: Bradycardic, regular RESPIRATORY:  Clear to auscultation without rales, wheezing or rhonchi  ABDOMEN: Soft, non-distended EXTREMITIES:  No edema; No deformity   ASSESSMENT AND PLAN:     #. Paroxysmal atrial fibrillation, symptomatic: Likely vagally mediated.  Events predominantly occurred during sleep.  He was having episodes frequently enough that he was taking flecainide  at least once monthly.  During our last visit, we transition to a scheduled flecainide  regimen. He has had no events since changing to scheduled flecainide  100mg  twice daily.  #. High risk medication use: Flecainide .  PR 208 ms and QRS 110 ms. - ETT test  done 05/2023 with no QRS widening.  - Continue flecainide  100mg  BID.  - Metoprolol  tartrate 25mg  as needed.   #.  Secondary hypercoagulable state due to atrial fibrillation: His CHA2DS2-VASc score is now 2 (age only).  - We have had extensive discussions previously and again today regarding risks and benefits of oral anti-coagulation. Based on CHADSVASC of 2 our formal recommendation would be for OAC for stroke prophylaxis. However, he is a cyclist and believes his risk of anti-coagulation would be higher than benefit at present. He is currently not having any episodes of AF on flecainide  and is closely monitoring his heart rate/rhythm continuously with an Apple Watch. If he has recurrence of AF then we will start OAC or pursue Watchman.    #.  White coat hypertension: Patient's blood pressure is normal in clinic today.  - He is enrolled in a blood pressure monitoring program via his PCP.  Follow up with Dr. Kennyth in 6 months.  Signed, Fonda Kennyth, MD

## 2024-03-31 ENCOUNTER — Encounter: Payer: Self-pay | Admitting: Cardiology

## 2024-03-31 ENCOUNTER — Ambulatory Visit: Attending: Cardiology | Admitting: Cardiology

## 2024-03-31 VITALS — BP 134/81 | HR 51 | Ht 74.0 in | Wt 181.0 lb

## 2024-03-31 DIAGNOSIS — I480 Paroxysmal atrial fibrillation: Secondary | ICD-10-CM | POA: Diagnosis not present

## 2024-03-31 DIAGNOSIS — I471 Supraventricular tachycardia, unspecified: Secondary | ICD-10-CM

## 2024-03-31 DIAGNOSIS — Z79899 Other long term (current) drug therapy: Secondary | ICD-10-CM | POA: Diagnosis not present

## 2024-03-31 DIAGNOSIS — R030 Elevated blood-pressure reading, without diagnosis of hypertension: Secondary | ICD-10-CM | POA: Insufficient documentation

## 2024-03-31 DIAGNOSIS — D6869 Other thrombophilia: Secondary | ICD-10-CM | POA: Insufficient documentation

## 2024-03-31 NOTE — Patient Instructions (Signed)
 Medication Instructions:  Your physician recommends that you continue on your current medications as directed. Please refer to the Current Medication list given to you today.  *If you need a refill on your cardiac medications before your next appointment, please call your pharmacy*  Lab Work: None ordered.  If you have labs (blood work) drawn today and your tests are completely normal, you will receive your results only by: MyChart Message (if you have MyChart) OR A paper copy in the mail If you have any lab test that is abnormal or we need to change your treatment, we will call you to review the results.  Testing/Procedures: None ordered.   Follow-Up: At [facility], you and your health needs are our priority.  As part of our continuing mission to provide you with exceptional heart care, our providers are all part of one team.  This team includes your primary Cardiologist (physician) and Advanced Practice Providers or APPs (Physician Assistants and Nurse Practitioners) who all work together to provide you with the care you need, when you need it.  Your next appointment:   6 month f/u

## 2024-04-04 DIAGNOSIS — I480 Paroxysmal atrial fibrillation: Secondary | ICD-10-CM | POA: Diagnosis not present

## 2024-04-18 DIAGNOSIS — I480 Paroxysmal atrial fibrillation: Secondary | ICD-10-CM | POA: Diagnosis not present

## 2024-04-18 DIAGNOSIS — E7800 Pure hypercholesterolemia, unspecified: Secondary | ICD-10-CM | POA: Diagnosis not present

## 2024-04-18 DIAGNOSIS — N401 Enlarged prostate with lower urinary tract symptoms: Secondary | ICD-10-CM | POA: Diagnosis not present

## 2024-04-25 DIAGNOSIS — Z23 Encounter for immunization: Secondary | ICD-10-CM | POA: Diagnosis not present

## 2024-05-04 DIAGNOSIS — I480 Paroxysmal atrial fibrillation: Secondary | ICD-10-CM | POA: Diagnosis not present

## 2024-05-17 DIAGNOSIS — L821 Other seborrheic keratosis: Secondary | ICD-10-CM | POA: Diagnosis not present

## 2024-05-17 DIAGNOSIS — L820 Inflamed seborrheic keratosis: Secondary | ICD-10-CM | POA: Diagnosis not present

## 2024-05-17 DIAGNOSIS — L570 Actinic keratosis: Secondary | ICD-10-CM | POA: Diagnosis not present

## 2024-05-17 DIAGNOSIS — D225 Melanocytic nevi of trunk: Secondary | ICD-10-CM | POA: Diagnosis not present

## 2024-05-19 DIAGNOSIS — I480 Paroxysmal atrial fibrillation: Secondary | ICD-10-CM | POA: Diagnosis not present

## 2024-05-19 DIAGNOSIS — N401 Enlarged prostate with lower urinary tract symptoms: Secondary | ICD-10-CM | POA: Diagnosis not present

## 2024-05-19 DIAGNOSIS — E7800 Pure hypercholesterolemia, unspecified: Secondary | ICD-10-CM | POA: Diagnosis not present

## 2024-05-22 ENCOUNTER — Other Ambulatory Visit: Payer: Self-pay | Admitting: Cardiology

## 2024-05-23 MED ORDER — FLECAINIDE ACETATE 100 MG PO TABS: 100.0000 mg | ORAL_TABLET | Freq: Two times a day (BID) | ORAL | 3 refills | Status: DC

## 2024-06-03 DIAGNOSIS — I480 Paroxysmal atrial fibrillation: Secondary | ICD-10-CM | POA: Diagnosis not present

## 2024-06-18 DIAGNOSIS — E7800 Pure hypercholesterolemia, unspecified: Secondary | ICD-10-CM | POA: Diagnosis not present

## 2024-06-18 DIAGNOSIS — N401 Enlarged prostate with lower urinary tract symptoms: Secondary | ICD-10-CM | POA: Diagnosis not present

## 2024-06-18 DIAGNOSIS — I480 Paroxysmal atrial fibrillation: Secondary | ICD-10-CM | POA: Diagnosis not present

## 2024-07-07 ENCOUNTER — Encounter: Payer: Self-pay | Admitting: Cardiology

## 2024-07-07 MED ORDER — FLECAINIDE ACETATE 150 MG PO TABS: 150.0000 mg | ORAL_TABLET | Freq: Two times a day (BID) | ORAL | 3 refills | Status: DC

## 2024-07-07 NOTE — Telephone Encounter (Signed)
 Increase flecainide  to 150mg  BID starting this evening. Bring into office for EP APP visit with ECG next Tuesday or Wednesday.   Josh

## 2024-07-07 NOTE — Addendum Note (Signed)
 Addended by: Amry Cathy L on: 07/07/2024 06:01 PM   Modules accepted: Orders

## 2024-07-10 NOTE — Progress Notes (Unsigned)
 " Electrophysiology Office Note:   Date:  07/11/2024  ID:  Austin Graves, Austin Graves 02-11-1946, MRN [id number]  Primary Cardiologist: None Primary Heart Failure: None Electrophysiologist: Fonda Kitty, MD      History of Present Illness:   Austin Graves is a 78 y.o. male with h/o AF, PSVT, HLD seen today for routine electrophysiology followup.   Pt called EP Clinic 07/07/24 reporting suspected AF with HR 110's.  Flecainide  was increased to 150 mg BID.   Since last being seen in our clinic the patient reports doing he had an episode of AF that began on 12/19 and lasted though 07/09/24 and ultimately broke after Flecainide  was increased.  He has been taking 150 mg BID since 12/19.  He notes he is not interested in ablation at this time as he would have to take ~ 4 months off from outdoor biking due to anticoagulation.    He denies chest pain, palpitations, dyspnea, PND, orthopnea, nausea, vomiting, dizziness, syncope, edema, weight gain, or early satiety.   Review of systems complete and found to be negative unless listed in HPI.   EP Information / Studies Reviewed:    EKG is ordered today. Personal review as below.  EKG Interpretation Date/Time:  Tuesday July 11 2024 09:01:25 EST Ventricular Rate:  52 PR Interval:  232 QRS Duration:  118 QT Interval:  488 QTC Calculation: 453 R Axis:   86  Text Interpretation: Sinus bradycardia with 1st degree A-V block Confirmed by Aniceto Jarvis ([id number]) on 07/11/2024 9:07:17 AM    Arrhythmia / AAD / Pertinent EP Studies AF  ETT 05/2023 > no QRS widening   Flecainide  07/2021 as pill in pocket > BID dosing 03/2024 >    Risk Assessment/Calculations:    CHA2DS2-VASc Score = 2   This indicates a 2.2% annual risk of stroke. The patient's score is based upon: CHF History: 0 HTN History: 0 Diabetes History: 0 Stroke History: 0 Vascular Disease History: 0 Age Score: 2 Gender Score: 0         Physical Exam:   VS:  BP (!) 140/80    Pulse (!) 52   Ht 6' 2 (1.88 m)   Wt 190 lb (86.2 kg)   SpO2 98%   BMI 24.39 kg/m    Wt Readings from Last 3 Encounters:  07/11/24 190 lb (86.2 kg)  03/31/24 181 lb (82.1 kg)  10/20/23 186 lb 6.4 oz (84.6 kg)     GEN: Well nourished, well developed in no acute distress NECK: No JVD; No carotid bruits CARDIAC: Regular rate and rhythm, no murmurs, rubs, gallops RESPIRATORY:  Clear to auscultation without rales, wheezing or rhonchi  ABDOMEN: Soft, non-tender, non-distended EXTREMITIES:  No edema; No deformity   ASSESSMENT AND PLAN:    Paroxysmal Atrial Fibrillation  High Risk Medication Monitoring: Flecainide   CHA2DS2-VASc 2  -EKG with SB, stable intervals > PR 09/2023 , current after increase 232 ms -continue flecainide  150 mg BID  -given prolonged episode of AF, begin AV nodal blocker with Diltiazem  30 mg BID (low HR). Reviewed indications of AV nodal blocker with patient in detail.  -metoprolol  25 mg PRN  -monitors with an Apple Watch   -discussed ablation again and considering in winter as he could ride a bike indoors   Secondary Hypercoagulable State  -pt has previously elected not to be on OAC due to risks with cycling, confirms   White Coat HTN  -BP mildly elevated in clinic, monitor   Follow up with EP  APP in 3 months for flecainide  / EKG review post dose change   Signed, Daphne Barrack, NP-C, AGACNP-BC [facility] - Electrophysiology  07/11/2024, 9:08 AM  "

## 2024-07-11 ENCOUNTER — Encounter: Payer: Self-pay | Admitting: Pulmonary Disease

## 2024-07-11 ENCOUNTER — Ambulatory Visit: Attending: Pulmonary Disease | Admitting: Pulmonary Disease

## 2024-07-11 VITALS — BP 140/80 | HR 52 | Ht 74.0 in | Wt 190.0 lb

## 2024-07-11 DIAGNOSIS — D6869 Other thrombophilia: Secondary | ICD-10-CM | POA: Insufficient documentation

## 2024-07-11 DIAGNOSIS — I480 Paroxysmal atrial fibrillation: Secondary | ICD-10-CM | POA: Diagnosis not present

## 2024-07-11 DIAGNOSIS — Z79899 Other long term (current) drug therapy: Secondary | ICD-10-CM | POA: Insufficient documentation

## 2024-07-11 MED ORDER — DILTIAZEM HCL 30 MG PO TABS: 30.0000 mg | ORAL_TABLET | Freq: Two times a day (BID) | ORAL | 3 refills | Status: DC

## 2024-07-11 NOTE — Patient Instructions (Addendum)
 Medication Instructions:  START Diltiazem  30 mg twice daily  *If you need a refill on your cardiac medications before your next appointment, please call your pharmacy*  Lab Work: NONE ORDERED  If you have labs (blood work) drawn today and your tests are completely normal, you will receive your results only by: MyChart Message (if you have MyChart) OR A paper copy in the mail If you have any lab test that is abnormal or we need to change your treatment, we will call you to review the results.  Testing/Procedures: NONE ORDERED  Follow-Up: At [facility], you and your health needs are our priority.  As part of our continuing mission to provide you with exceptional heart care, our providers are all part of one team.  This team includes your primary Cardiologist (physician) and Advanced Practice Providers or APPs (Physician Assistants and Nurse Practitioners) who all work together to provide you with the care you need, when you need it.  Your next appointment:   3 month(s)  Provider:   Fonda Kitty, MD or Daphne Barrack, NP  We recommend signing up for the patient portal called MyChart.  Sign up information is provided on this After Visit Summary.  MyChart is used to connect with patients for Virtual Visits (Telemedicine).  Patients are able to view lab/test results, encounter notes, upcoming appointments, etc.  Non-urgent messages can be sent to your provider as well.   To learn more about what you can do with MyChart, go to [email or web address].

## 2024-07-14 MED ORDER — FLECAINIDE ACETATE 100 MG PO TABS: 100.0000 mg | ORAL_TABLET | Freq: Two times a day (BID) | ORAL | 3 refills | Status: DC

## 2024-07-14 NOTE — Telephone Encounter (Signed)
 Flecainide  prescription has been updated.\

## 2024-07-25 MED ORDER — DILTIAZEM HCL 30 MG PO TABS: 15.0000 mg | ORAL_TABLET | Freq: Two times a day (BID) | ORAL | 3 refills | Status: AC

## 2024-07-25 NOTE — Addendum Note (Signed)
 Addended by: MAGDALINE NEEDLE on: 07/25/2024 01:05 PM   Modules accepted: Orders

## 2024-08-17 ENCOUNTER — Ambulatory Visit: Admitting: Cardiology

## 2024-08-17 NOTE — Progress Notes (Signed)
 " Electrophysiology Office Note:   Date:  08/18/2024  ID:  Graves, Austin 1946/06/09, MRN [id number]  Primary Cardiologist: None Electrophysiologist: Fonda Kitty, MD      History of Present Illness:   Austin Graves is a 79 y.o. male with h/o paroxysmal atrial fibrillation who is being seen today for follow up evaluation.  Discussed the use of AI scribe software for clinical note transcription with the patient, who gave verbal consent to proceed.  History of Present Illness Austin Graves is a 79 year old male with atrial fibrillation who presents with concerns about medication management and shortness of breath.  He has a history of atrial fibrillation and initially managed his condition with flecainide  as needed for episodes occurring about once a month. Over time, he began taking it daily, twice a day, starting in November 2024, to prevent episodes.  On July 06, 2024, he experienced an atrial fibrillation episode that began at night and lasted until July 09, 2024. Following this, his flecainide  dose was increased to 150 mg twice daily, and diltiazem  30 mg was added to his regimen.  After increasing the flecainide  dose, he experienced shortness of breath, particularly noticeable during cycling, which is a regular activity for him. His maximum heart rate during cycling was significantly reduced, prompting a reduction in flecainide  back to 100 mg twice daily and an adjustment of diltiazem  to 15 mg twice daily.  Since these adjustments, he feels better with reduced shortness of breath and has not experienced further atrial fibrillation episodes. He remains active, able to walk and cycle during episodes.  He is currently taking flecainide  100 mg twice daily and diltiazem  15 mg twice daily.   Review of systems complete and found to be negative unless listed in HPI.   EP Information / Studies Reviewed:          Apple Watch:         [facility] cardiovascular echo  04/08/2021: Normal LV size and function.  LVEF 55 to 60%. No significant valvular disease mention.   04/08/2021: No ischemia or infarction.  LVEF 52%.  Risk Assessment/Calculations:    CHA2DS2-VASc Score = 2   This indicates a 2.2% annual risk of stroke. The patient's score is based upon: CHF History: 0 HTN History: 0 Diabetes History: 0 Stroke History: 0 Vascular Disease History: 0 Age Score: 2 Gender Score: 0             Physical Exam:   VS:  BP 114/70 (BP Location: Left Arm, Patient Position: Sitting, Cuff Size: Normal)   Pulse (!) 50   Ht 6' 2 (1.88 m)   Wt 186 lb (84.4 kg)   SpO2 97%   BMI 23.88 kg/m    Wt Readings from Last 3 Encounters:  08/18/24 186 lb (84.4 kg)  07/11/24 190 lb (86.2 kg)  03/31/24 181 lb (82.1 kg)     GEN: Well nourished, well developed in no acute distress NECK: No JVD CARDIAC: Bradycardic, regular RESPIRATORY:  Clear to auscultation without rales, wheezing or rhonchi  ABDOMEN: Soft, non-distended EXTREMITIES:  No edema; No deformity   ASSESSMENT AND PLAN:     #. Paroxysmal atrial fibrillation, symptomatic: Likely vagally mediated.  Events predominantly occurred during sleep.  He was having episodes frequently enough that he was taking flecainide  at least once monthly.  During our last visit, we transition to a scheduled flecainide  regimen. He has had no events since changing to scheduled flecainide  100mg  twice daily.  #. High  risk medication use: Flecainide . Last PR and QRS stable 1 month ago. - ETT test done 05/2023 with no QRS widening.  - Continue flecainide  100mg  BID. Continue diltiazem  15mg  BID.  - We discussed that increasing medications for AF suppression will likely result in more bradycardia, which has historically led to exertional intolerance while cycling due to a blunted HR response. Conversely, decreasing his regimen will likely result in an increased AF burden. His best option would likely be catheter ablation as potential  replacement for medical management which would allow for improved heart rates at rest and during activity. Patient is not quite ready to commit to procedural intervention at this time, but would be an appropriate candidate should he choose to do so in the near future.    #.  Secondary hypercoagulable state due to atrial fibrillation: His CHA2DS2-VASc score is now 2 (age only).  - We have had extensive discussions previously and again today regarding risks and benefits of oral anti-coagulation. Based on CHADSVASC of 2 our formal recommendation would be for OAC for stroke prophylaxis. However, he is a cyclist and believes his risk of anti-coagulation would be higher than benefit at present. His AF burden on flecainide  is low and is closely monitoring his heart rate/rhythm continuously with an Apple Watch. If he has an increasing burden of AF then we will need to start OAC or pursue Watchman.    #.  White coat hypertension: Patient's blood pressure is normal in clinic today.  - He is enrolled in a blood pressure monitoring program via his PCP.  Follow up with EP Team in 6 months.  Signed, Fonda Kitty, MD  "

## 2024-08-18 ENCOUNTER — Ambulatory Visit: Attending: Cardiology | Admitting: Cardiology

## 2024-08-18 ENCOUNTER — Encounter: Payer: Self-pay | Admitting: Cardiology

## 2024-08-18 VITALS — BP 114/70 | HR 50 | Ht 74.0 in | Wt 186.0 lb

## 2024-08-18 DIAGNOSIS — Z79899 Other long term (current) drug therapy: Secondary | ICD-10-CM | POA: Diagnosis present

## 2024-08-18 DIAGNOSIS — R030 Elevated blood-pressure reading, without diagnosis of hypertension: Secondary | ICD-10-CM | POA: Diagnosis present

## 2024-08-18 DIAGNOSIS — D6869 Other thrombophilia: Secondary | ICD-10-CM | POA: Insufficient documentation

## 2024-08-18 DIAGNOSIS — I480 Paroxysmal atrial fibrillation: Secondary | ICD-10-CM | POA: Diagnosis present

## 2024-08-18 MED ORDER — FLECAINIDE ACETATE 100 MG PO TABS: 100.0000 mg | ORAL_TABLET | Freq: Two times a day (BID) | ORAL | 3 refills | Status: AC

## 2024-08-18 NOTE — Patient Instructions (Signed)
 Medication Instructions:  Your physician recommends that you continue on your current medications as directed. Please refer to the Current Medication list given to you today.  *If you need a refill on your cardiac medications before your next appointment, please call your pharmacy*  Follow-Up: At [facility], you and your health needs are our priority.  As part of our continuing mission to provide you with exceptional heart care, our providers are all part of one team.  This team includes your primary Cardiologist (physician) and Advanced Practice Providers or APPs (Physician Assistants and Nurse Practitioners) who all work together to provide you with the care you need, when you need it.  Your next appointment:   6 months   Provider:   You will see one of the following Advanced Practice Providers on your designated Care Team:   Charlies Arthur, [place] Ozell Jodie Passey, PA-C Suzann Riddle, NP Daphne Barrack, NP Artist Pouch, PA-C  Odis Phoenix, PA-C

## 2024-10-11 ENCOUNTER — Ambulatory Visit: Admitting: Pulmonary Disease

## 2024-11-06 ENCOUNTER — Ambulatory Visit: Admitting: Cardiology
